# Patient Record
Sex: Female | Born: 1950 | Race: White | Hispanic: No | Smoking: Current every day smoker
Health system: Southern US, Community
[De-identification: ages and names within clinical notes are randomized; demographics above are authoritative.]

## PROBLEM LIST (undated history)

## (undated) DIAGNOSIS — M79606 Pain in leg, unspecified: Secondary | ICD-10-CM

## (undated) DIAGNOSIS — F32A Depression, unspecified: Secondary | ICD-10-CM

## (undated) DIAGNOSIS — Z972 Presence of dental prosthetic device (complete) (partial): Secondary | ICD-10-CM

## (undated) DIAGNOSIS — S0990XA Unspecified injury of head, initial encounter: Secondary | ICD-10-CM

## (undated) DIAGNOSIS — T07XXXA Unspecified multiple injuries, initial encounter: Secondary | ICD-10-CM

## (undated) DIAGNOSIS — M25551 Pain in right hip: Secondary | ICD-10-CM

## (undated) DIAGNOSIS — G894 Chronic pain syndrome: Secondary | ICD-10-CM

## (undated) DIAGNOSIS — F329 Major depressive disorder, single episode, unspecified: Secondary | ICD-10-CM

## (undated) DIAGNOSIS — F419 Anxiety disorder, unspecified: Secondary | ICD-10-CM

## (undated) DIAGNOSIS — Z87820 Personal history of traumatic brain injury: Secondary | ICD-10-CM

## (undated) DIAGNOSIS — M542 Cervicalgia: Secondary | ICD-10-CM

## (undated) DIAGNOSIS — M47812 Spondylosis without myelopathy or radiculopathy, cervical region: Secondary | ICD-10-CM

## (undated) DIAGNOSIS — I10 Essential (primary) hypertension: Secondary | ICD-10-CM

## (undated) HISTORY — DX: Major depressive disorder, single episode, unspecified: F32.9

## (undated) HISTORY — DX: Unspecified multiple injuries, initial encounter: T07.XXXA

## (undated) HISTORY — DX: Unspecified injury of head, initial encounter: S09.90XA

## (undated) HISTORY — PX: BREAST EXCISIONAL BIOPSY: SUR124

## (undated) HISTORY — DX: Spondylosis without myelopathy or radiculopathy, cervical region: M47.812

## (undated) HISTORY — DX: Pain in right hip: M25.551

## (undated) HISTORY — PX: LEG SURGERY: SHX1003

## (undated) HISTORY — DX: Essential (primary) hypertension: I10

## (undated) HISTORY — DX: Depression, unspecified: F32.A

## (undated) HISTORY — DX: Anxiety disorder, unspecified: F41.9

## (undated) HISTORY — PX: ABDOMINAL HYSTERECTOMY: SHX81

## (undated) HISTORY — DX: Cervicalgia: M54.2

## (undated) HISTORY — DX: Pain in leg, unspecified: M79.606

## (undated) HISTORY — DX: Personal history of traumatic brain injury: Z87.820

---

## 2004-05-02 ENCOUNTER — Ambulatory Visit: Payer: Self-pay | Admitting: Internal Medicine

## 2005-08-28 ENCOUNTER — Ambulatory Visit: Payer: Self-pay | Admitting: Obstetrics and Gynecology

## 2007-06-17 ENCOUNTER — Ambulatory Visit: Payer: Self-pay | Admitting: Internal Medicine

## 2008-06-27 ENCOUNTER — Ambulatory Visit: Payer: Self-pay | Admitting: Family Medicine

## 2008-08-09 ENCOUNTER — Ambulatory Visit: Payer: Self-pay | Admitting: Internal Medicine

## 2011-02-12 ENCOUNTER — Inpatient Hospital Stay: Payer: Self-pay | Admitting: Psychiatry

## 2011-02-25 ENCOUNTER — Inpatient Hospital Stay: Payer: Self-pay | Admitting: Psychiatry

## 2012-09-02 ENCOUNTER — Emergency Department: Payer: Self-pay | Admitting: Emergency Medicine

## 2012-09-06 DIAGNOSIS — N814 Uterovaginal prolapse, unspecified: Secondary | ICD-10-CM | POA: Insufficient documentation

## 2012-09-06 DIAGNOSIS — T07XXXA Unspecified multiple injuries, initial encounter: Secondary | ICD-10-CM

## 2012-09-06 DIAGNOSIS — I609 Nontraumatic subarachnoid hemorrhage, unspecified: Secondary | ICD-10-CM | POA: Insufficient documentation

## 2012-09-06 DIAGNOSIS — F4310 Post-traumatic stress disorder, unspecified: Secondary | ICD-10-CM | POA: Insufficient documentation

## 2012-09-06 DIAGNOSIS — IMO0002 Reserved for concepts with insufficient information to code with codable children: Secondary | ICD-10-CM | POA: Insufficient documentation

## 2012-09-06 DIAGNOSIS — S32029A Unspecified fracture of second lumbar vertebra, initial encounter for closed fracture: Secondary | ICD-10-CM | POA: Insufficient documentation

## 2012-09-06 DIAGNOSIS — T1490XA Injury, unspecified, initial encounter: Secondary | ICD-10-CM | POA: Insufficient documentation

## 2012-09-06 DIAGNOSIS — S32599A Other specified fracture of unspecified pubis, initial encounter for closed fracture: Secondary | ICD-10-CM | POA: Insufficient documentation

## 2012-09-06 DIAGNOSIS — F1020 Alcohol dependence, uncomplicated: Secondary | ICD-10-CM | POA: Insufficient documentation

## 2012-09-06 DIAGNOSIS — S32509A Unspecified fracture of unspecified pubis, initial encounter for closed fracture: Secondary | ICD-10-CM | POA: Insufficient documentation

## 2012-09-06 DIAGNOSIS — S82209A Unspecified fracture of shaft of unspecified tibia, initial encounter for closed fracture: Secondary | ICD-10-CM | POA: Insufficient documentation

## 2012-09-06 HISTORY — DX: Unspecified multiple injuries, initial encounter: T07.XXXA

## 2012-10-30 ENCOUNTER — Encounter: Payer: Self-pay | Admitting: Physical Medicine and Rehabilitation

## 2012-11-06 ENCOUNTER — Encounter: Payer: Self-pay | Admitting: Physical Medicine and Rehabilitation

## 2012-12-07 ENCOUNTER — Encounter: Payer: Self-pay | Admitting: Physical Medicine and Rehabilitation

## 2013-01-07 DIAGNOSIS — N3946 Mixed incontinence: Secondary | ICD-10-CM | POA: Insufficient documentation

## 2013-01-07 DIAGNOSIS — N8111 Cystocele, midline: Secondary | ICD-10-CM | POA: Insufficient documentation

## 2014-02-28 ENCOUNTER — Emergency Department: Payer: Self-pay | Admitting: Emergency Medicine

## 2014-02-28 LAB — URINALYSIS, COMPLETE
BLOOD: NEGATIVE
Bilirubin,UR: NEGATIVE
GLUCOSE, UR: NEGATIVE mg/dL (ref 0–75)
KETONE: NEGATIVE
Nitrite: NEGATIVE
PH: 5 (ref 4.5–8.0)
PROTEIN: NEGATIVE
RBC,UR: 2 /HPF (ref 0–5)
SPECIFIC GRAVITY: 1.016 (ref 1.003–1.030)
Squamous Epithelial: 3
WBC UR: 6 /HPF (ref 0–5)

## 2014-02-28 LAB — CBC WITH DIFFERENTIAL/PLATELET
BASOS ABS: 0.1 10*3/uL (ref 0.0–0.1)
Basophil %: 0.8 %
Eosinophil #: 0 10*3/uL (ref 0.0–0.7)
Eosinophil %: 0.4 %
HCT: 40.8 % (ref 35.0–47.0)
HGB: 13.4 g/dL (ref 12.0–16.0)
LYMPHS ABS: 2.3 10*3/uL (ref 1.0–3.6)
Lymphocyte %: 18.8 %
MCH: 32 pg (ref 26.0–34.0)
MCHC: 32.8 g/dL (ref 32.0–36.0)
MCV: 98 fL (ref 80–100)
MONOS PCT: 7.5 %
Monocyte #: 0.9 x10 3/mm (ref 0.2–0.9)
Neutrophil #: 9 10*3/uL — ABNORMAL HIGH (ref 1.4–6.5)
Neutrophil %: 72.5 %
PLATELETS: 433 10*3/uL (ref 150–440)
RBC: 4.19 10*6/uL (ref 3.80–5.20)
RDW: 13.5 % (ref 11.5–14.5)
WBC: 12.4 10*3/uL — ABNORMAL HIGH (ref 3.6–11.0)

## 2014-02-28 LAB — COMPREHENSIVE METABOLIC PANEL
ALBUMIN: 3.9 g/dL (ref 3.4–5.0)
AST: 19 U/L (ref 15–37)
Alkaline Phosphatase: 103 U/L
Anion Gap: 8 (ref 7–16)
BILIRUBIN TOTAL: 0.4 mg/dL (ref 0.2–1.0)
BUN: 8 mg/dL (ref 7–18)
Calcium, Total: 8.8 mg/dL (ref 8.5–10.1)
Chloride: 103 mmol/L (ref 98–107)
Co2: 26 mmol/L (ref 21–32)
Creatinine: 0.76 mg/dL (ref 0.60–1.30)
EGFR (African American): >60
EGFR (Non-African Amer.): >60
GLUCOSE: 113 mg/dL — AB (ref 65–99)
Osmolality: 273 (ref 275–301)
POTASSIUM: 3.9 mmol/L (ref 3.5–5.1)
SGPT (ALT): 17 U/L
Sodium: 137 mmol/L (ref 136–145)
Total Protein: 7.1 g/dL (ref 6.4–8.2)

## 2014-03-25 DIAGNOSIS — Z8679 Personal history of other diseases of the circulatory system: Secondary | ICD-10-CM | POA: Insufficient documentation

## 2014-03-25 DIAGNOSIS — I10 Essential (primary) hypertension: Secondary | ICD-10-CM | POA: Insufficient documentation

## 2014-03-25 DIAGNOSIS — E785 Hyperlipidemia, unspecified: Secondary | ICD-10-CM | POA: Insufficient documentation

## 2014-03-25 DIAGNOSIS — I341 Nonrheumatic mitral (valve) prolapse: Secondary | ICD-10-CM | POA: Insufficient documentation

## 2014-05-13 ENCOUNTER — Ambulatory Visit: Payer: Self-pay | Admitting: Pain Medicine

## 2015-02-06 ENCOUNTER — Other Ambulatory Visit: Payer: Self-pay | Admitting: Pain Medicine

## 2015-02-06 ENCOUNTER — Ambulatory Visit: Payer: Medicare Other | Attending: Pain Medicine | Admitting: Pain Medicine

## 2015-02-06 ENCOUNTER — Encounter: Payer: Self-pay | Admitting: Pain Medicine

## 2015-02-06 VITALS — BP 113/67 | HR 100 | Temp 98.6°F | Resp 18 | Ht 62.0 in | Wt 126.0 lb

## 2015-02-06 DIAGNOSIS — E785 Hyperlipidemia, unspecified: Secondary | ICD-10-CM | POA: Insufficient documentation

## 2015-02-06 DIAGNOSIS — M542 Cervicalgia: Secondary | ICD-10-CM | POA: Insufficient documentation

## 2015-02-06 DIAGNOSIS — G8929 Other chronic pain: Secondary | ICD-10-CM | POA: Diagnosis not present

## 2015-02-06 DIAGNOSIS — M47896 Other spondylosis, lumbar region: Secondary | ICD-10-CM

## 2015-02-06 DIAGNOSIS — F1190 Opioid use, unspecified, uncomplicated: Secondary | ICD-10-CM

## 2015-02-06 DIAGNOSIS — M545 Low back pain, unspecified: Secondary | ICD-10-CM

## 2015-02-06 DIAGNOSIS — M47816 Spondylosis without myelopathy or radiculopathy, lumbar region: Secondary | ICD-10-CM | POA: Diagnosis not present

## 2015-02-06 DIAGNOSIS — M79606 Pain in leg, unspecified: Secondary | ICD-10-CM

## 2015-02-06 DIAGNOSIS — Z87820 Personal history of traumatic brain injury: Secondary | ICD-10-CM

## 2015-02-06 DIAGNOSIS — F17200 Nicotine dependence, unspecified, uncomplicated: Secondary | ICD-10-CM | POA: Diagnosis not present

## 2015-02-06 DIAGNOSIS — M25551 Pain in right hip: Secondary | ICD-10-CM | POA: Diagnosis not present

## 2015-02-06 DIAGNOSIS — Z79891 Long term (current) use of opiate analgesic: Secondary | ICD-10-CM | POA: Insufficient documentation

## 2015-02-06 DIAGNOSIS — F1120 Opioid dependence, uncomplicated: Secondary | ICD-10-CM

## 2015-02-06 DIAGNOSIS — F411 Generalized anxiety disorder: Secondary | ICD-10-CM | POA: Diagnosis not present

## 2015-02-06 DIAGNOSIS — S32020D Wedge compression fracture of second lumbar vertebra, subsequent encounter for fracture with routine healing: Secondary | ICD-10-CM | POA: Insufficient documentation

## 2015-02-06 DIAGNOSIS — M47812 Spondylosis without myelopathy or radiculopathy, cervical region: Secondary | ICD-10-CM

## 2015-02-06 DIAGNOSIS — M5412 Radiculopathy, cervical region: Secondary | ICD-10-CM

## 2015-02-06 DIAGNOSIS — G894 Chronic pain syndrome: Secondary | ICD-10-CM | POA: Diagnosis not present

## 2015-02-06 DIAGNOSIS — F329 Major depressive disorder, single episode, unspecified: Secondary | ICD-10-CM | POA: Insufficient documentation

## 2015-02-06 DIAGNOSIS — Z5181 Encounter for therapeutic drug level monitoring: Secondary | ICD-10-CM

## 2015-02-06 DIAGNOSIS — M5416 Radiculopathy, lumbar region: Secondary | ICD-10-CM

## 2015-02-06 DIAGNOSIS — M4856XA Collapsed vertebra, not elsewhere classified, lumbar region, initial encounter for fracture: Secondary | ICD-10-CM | POA: Insufficient documentation

## 2015-02-06 DIAGNOSIS — Z79899 Other long term (current) drug therapy: Secondary | ICD-10-CM

## 2015-02-06 DIAGNOSIS — M791 Myalgia: Secondary | ICD-10-CM

## 2015-02-06 DIAGNOSIS — M7918 Myalgia, other site: Secondary | ICD-10-CM

## 2015-02-06 DIAGNOSIS — F32A Depression, unspecified: Secondary | ICD-10-CM

## 2015-02-06 DIAGNOSIS — M546 Pain in thoracic spine: Secondary | ICD-10-CM | POA: Diagnosis present

## 2015-02-06 HISTORY — DX: Pain in leg, unspecified: M79.606

## 2015-02-06 HISTORY — DX: Personal history of traumatic brain injury: Z87.820

## 2015-02-06 HISTORY — DX: Cervicalgia: M54.2

## 2015-02-06 HISTORY — DX: Spondylosis without myelopathy or radiculopathy, cervical region: M47.812

## 2015-02-06 HISTORY — DX: Pain in right hip: M25.551

## 2015-02-06 MED ORDER — TRAMADOL HCL 50 MG PO TABS: 100.0000 mg | ORAL_TABLET | Freq: Four times a day (QID) | ORAL | Status: DC | PRN

## 2015-02-06 MED ORDER — CYCLOBENZAPRINE HCL 10 MG PO TABS: 10.0000 mg | ORAL_TABLET | Freq: Three times a day (TID) | ORAL | Status: DC | PRN
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
## 2015-02-06 NOTE — Patient Instructions (Addendum)
Smoking Cessation, Tips for Success If you are ready to quit smoking, congratulations! You have chosen to help yourself be healthier. Cigarettes bring nicotine, tar, carbon monoxide, and other irritants into your body. Your lungs, heart, and blood vessels will be able to work better without these poisons. There are many different ways to quit smoking. Nicotine gum, nicotine patches, a nicotine inhaler, or nicotine nasal spray can help with physical craving. Hypnosis, support groups, and medicines help break the habit of smoking. WHAT THINGS CAN I DO TO MAKE QUITTING EASIER?  Here are some tips to help you quit for good:  Pick a date when you will quit smoking completely. Tell all of your friends and family about your plan to quit on that date.  Do not try to slowly cut down on the number of cigarettes you are smoking. Pick a quit date and quit smoking completely starting on that day.  Throw away all cigarettes.   Clean and remove all ashtrays from your home, work, and car.  On a card, write down your reasons for quitting. Carry the card with you and read it when you get the urge to smoke.  Cleanse your body of nicotine. Drink enough water and fluids to keep your urine clear or pale yellow. Do this after quitting to flush the nicotine from your body.  Learn to predict your moods. Do not let a bad situation be your excuse to have a cigarette. Some situations in your life might tempt you into wanting a cigarette.  Never have "just one" cigarette. It leads to wanting another and another. Remind yourself of your decision to quit.  Change habits associated with smoking. If you smoked while driving or when feeling stressed, try other activities to replace smoking. Stand up when drinking your coffee. Brush your teeth after eating. Sit in a different chair when you read the paper. Avoid alcohol while trying to quit, and try to drink fewer caffeinated beverages. Alcohol and caffeine may urge you to  smoke.  Avoid foods and drinks that can trigger a desire to smoke, such as sugary or spicy foods and alcohol.  Ask people who smoke not to smoke around you.  Have something planned to do right after eating or having a cup of coffee. For example, plan to take a walk or exercise.  Try a relaxation exercise to calm you down and decrease your stress. Remember, you may be tense and nervous for the first 2 weeks after you quit, but this will pass.  Find new activities to keep your hands busy. Play with a pen, coin, or rubber band. Doodle or draw things on paper.  Brush your teeth right after eating. This will help cut down on the craving for the taste of tobacco after meals. You can also try mouthwash.   Use oral substitutes in place of cigarettes. Try using lemon drops, carrots, cinnamon sticks, or chewing gum. Keep them handy so they are available when you have the urge to smoke.  When you have the urge to smoke, try deep breathing.  Designate your home as a nonsmoking area.  If you are a heavy smoker, ask your health care provider about a prescription for nicotine chewing gum. It can ease your withdrawal from nicotine.  Reward yourself. Set aside the cigarette money you save and buy yourself something nice.  Look for support from others. Join a support group or smoking cessation program. Ask someone at home or at work to help you with your plan   to quit smoking.  Always ask yourself, "Do I need this cigarette or is this just a reflex?" Tell yourself, "Today, I choose not to smoke," or "I do not want to smoke." You are reminding yourself of your decision to quit.  Do not replace cigarette smoking with electronic cigarettes (commonly called e-cigarettes). The safety of e-cigarettes is unknown, and some may contain harmful chemicals.  If you relapse, do not give up! Plan ahead and think about what you will do the next time you get the urge to smoke. HOW WILL I FEEL WHEN I QUIT SMOKING? You  may have symptoms of withdrawal because your body is used to nicotine (the addictive substance in cigarettes). You may crave cigarettes, be irritable, feel very hungry, cough often, get headaches, or have difficulty concentrating. The withdrawal symptoms are only temporary. They are strongest when you first quit but will go away within 10-14 days. When withdrawal symptoms occur, stay in control. Think about your reasons for quitting. Remind yourself that these are signs that your body is healing and getting used to being without cigarettes. Remember that withdrawal symptoms are easier to treat than the major diseases that smoking can cause.  Even after the withdrawal is over, expect periodic urges to smoke. However, these cravings are generally short lived and will go away whether you smoke or not. Do not smoke! WHAT RESOURCES ARE AVAILABLE TO HELP ME QUIT SMOKING? Your health care provider can direct you to community resources or hospitals for support, which may include:  Group support.  Education.  Hypnosis.  Therapy.   This information is not intended to replace advice given to you by your health care provider. Make sure you discuss any questions you have with your health care provider.   Document Released: 12/22/2003 Document Revised: 04/15/2014 Document Reviewed: 09/10/2012 Elsevier Interactive Patient Education 2016 [employer] Take Cyclobenzaprine 5 mg  Two tablets three times per day until you get the new prescription filled, then 1 tablet three times per day

## 2015-02-06 NOTE — Progress Notes (Signed)
Safety precautions to be maintained throughout the outpatient stay will include: orient to surroundings, keep bed in low position, maintain call bell within reach at all times, provide assistance with transfer out of bed and ambulation. Pill count Tramadol #[id number]

## 2015-02-11 LAB — TOXASSURE SELECT 13 (MW), URINE: PDF: 0

## 2015-02-20 ENCOUNTER — Ambulatory Visit: Payer: Medicare Other | Attending: Pain Medicine | Admitting: Pain Medicine

## 2015-02-20 ENCOUNTER — Encounter: Payer: Self-pay | Admitting: Pain Medicine

## 2015-02-20 VITALS — BP 106/72 | HR 109 | Temp 99.1°F | Resp 16 | Ht 62.0 in | Wt 125.0 lb

## 2015-02-20 DIAGNOSIS — M5412 Radiculopathy, cervical region: Secondary | ICD-10-CM | POA: Diagnosis not present

## 2015-02-20 DIAGNOSIS — M47812 Spondylosis without myelopathy or radiculopathy, cervical region: Secondary | ICD-10-CM | POA: Diagnosis not present

## 2015-02-20 DIAGNOSIS — M79604 Pain in right leg: Secondary | ICD-10-CM

## 2015-02-20 DIAGNOSIS — G8929 Other chronic pain: Secondary | ICD-10-CM

## 2015-02-20 DIAGNOSIS — Z79891 Long term (current) use of opiate analgesic: Secondary | ICD-10-CM | POA: Diagnosis not present

## 2015-02-20 DIAGNOSIS — F1190 Opioid use, unspecified, uncomplicated: Secondary | ICD-10-CM

## 2015-02-20 MED ORDER — TRAMADOL HCL 50 MG PO TABS: 100.0000 mg | ORAL_TABLET | Freq: Four times a day (QID) | ORAL | Status: DC | PRN

## 2015-02-20 MED ORDER — CYCLOBENZAPRINE HCL 10 MG PO TABS: 10.0000 mg | ORAL_TABLET | Freq: Three times a day (TID) | ORAL | Status: DC | PRN

## 2015-02-20 NOTE — Progress Notes (Signed)
Safety precautions to be maintained throughout the outpatient stay will include: orient to surroundings, keep bed in low position, maintain call bell within reach at all times, provide assistance with transfer out of bed and ambulation.  

## 2015-02-20 NOTE — Patient Instructions (Signed)
Smoking Cessation, Tips for Success If you are ready to quit smoking, congratulations! You have chosen to help yourself be healthier. Cigarettes bring nicotine, tar, carbon monoxide, and other irritants into your body. Your lungs, heart, and blood vessels will be able to work better without these poisons. There are many different ways to quit smoking. Nicotine gum, nicotine patches, a nicotine inhaler, or nicotine nasal spray can help with physical craving. Hypnosis, support groups, and medicines help break the habit of smoking. WHAT THINGS CAN I DO TO MAKE QUITTING EASIER?  Here are some tips to help you quit for good:  Pick a date when you will quit smoking completely. Tell all of your friends and family about your plan to quit on that date.  Do not try to slowly cut down on the number of cigarettes you are smoking. Pick a quit date and quit smoking completely starting on that day.  Throw away all cigarettes.   Clean and remove all ashtrays from your home, work, and car.  On a card, write down your reasons for quitting. Carry the card with you and read it when you get the urge to smoke.  Cleanse your body of nicotine. Drink enough water and fluids to keep your urine clear or pale yellow. Do this after quitting to flush the nicotine from your body.  Learn to predict your moods. Do not let a bad situation be your excuse to have a cigarette. Some situations in your life might tempt you into wanting a cigarette.  Never have "just one" cigarette. It leads to wanting another and another. Remind yourself of your decision to quit.  Change habits associated with smoking. If you smoked while driving or when feeling stressed, try other activities to replace smoking. Stand up when drinking your coffee. Brush your teeth after eating. Sit in a different chair when you read the paper. Avoid alcohol while trying to quit, and try to drink fewer caffeinated beverages. Alcohol and caffeine may urge you to  smoke.  Avoid foods and drinks that can trigger a desire to smoke, such as sugary or spicy foods and alcohol.  Ask people who smoke not to smoke around you.  Have something planned to do right after eating or having a cup of coffee. For example, plan to take a walk or exercise.  Try a relaxation exercise to calm you down and decrease your stress. Remember, you may be tense and nervous for the first 2 weeks after you quit, but this will pass.  Find new activities to keep your hands busy. Play with a pen, coin, or rubber band. Doodle or draw things on paper.  Brush your teeth right after eating. This will help cut down on the craving for the taste of tobacco after meals. You can also try mouthwash.   Use oral substitutes in place of cigarettes. Try using lemon drops, carrots, cinnamon sticks, or chewing gum. Keep them handy so they are available when you have the urge to smoke.  When you have the urge to smoke, try deep breathing.  Designate your home as a nonsmoking area.  If you are a heavy smoker, ask your health care provider about a prescription for nicotine chewing gum. It can ease your withdrawal from nicotine.  Reward yourself. Set aside the cigarette money you save and buy yourself something nice.  Look for support from others. Join a support group or smoking cessation program. Ask someone at home or at work to help you with your plan   to quit smoking.  Always ask yourself, "Do I need this cigarette or is this just a reflex?" Tell yourself, "Today, I choose not to smoke," or "I do not want to smoke." You are reminding yourself of your decision to quit.  Do not replace cigarette smoking with electronic cigarettes (commonly called e-cigarettes). The safety of e-cigarettes is unknown, and some may contain harmful chemicals.  If you relapse, do not give up! Plan ahead and think about what you will do the next time you get the urge to smoke. HOW WILL I FEEL WHEN I QUIT SMOKING? You  may have symptoms of withdrawal because your body is used to nicotine (the addictive substance in cigarettes). You may crave cigarettes, be irritable, feel very hungry, cough often, get headaches, or have difficulty concentrating. The withdrawal symptoms are only temporary. They are strongest when you first quit but will go away within 10-14 days. When withdrawal symptoms occur, stay in control. Think about your reasons for quitting. Remind yourself that these are signs that your body is healing and getting used to being without cigarettes. Remember that withdrawal symptoms are easier to treat than the major diseases that smoking can cause.  Even after the withdrawal is over, expect periodic urges to smoke. However, these cravings are generally short lived and will go away whether you smoke or not. Do not smoke! WHAT RESOURCES ARE AVAILABLE TO HELP ME QUIT SMOKING? Your health care provider can direct you to community resources or hospitals for support, which may include:  Group support.  Education.  Hypnosis.  Therapy.   This information is not intended to replace advice given to you by your health care provider. Make sure you discuss any questions you have with your health care provider.   Document Released: 12/22/2003 Document Revised: 04/15/2014 Document Reviewed: 09/10/2012 Elsevier Interactive Patient Education 2016 [employer]  

## 2015-02-21 DIAGNOSIS — M79604 Pain in right leg: Secondary | ICD-10-CM

## 2015-02-21 DIAGNOSIS — G8929 Other chronic pain: Secondary | ICD-10-CM | POA: Insufficient documentation
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
## 2015-02-27 ENCOUNTER — Other Ambulatory Visit: Payer: Self-pay | Admitting: Pain Medicine

## 2015-05-22 ENCOUNTER — Encounter: Payer: Self-pay | Admitting: Pain Medicine

## 2015-05-22 ENCOUNTER — Other Ambulatory Visit: Payer: Self-pay | Admitting: Pain Medicine

## 2015-05-22 ENCOUNTER — Ambulatory Visit: Payer: Medicare Other | Attending: Pain Medicine | Admitting: Pain Medicine

## 2015-05-22 VITALS — BP 123/94 | HR 99 | Temp 98.8°F | Resp 16 | Ht 62.0 in | Wt 130.0 lb

## 2015-05-22 DIAGNOSIS — Z79891 Long term (current) use of opiate analgesic: Secondary | ICD-10-CM

## 2015-05-22 DIAGNOSIS — M4856XS Collapsed vertebra, not elsewhere classified, lumbar region, sequela of fracture: Secondary | ICD-10-CM | POA: Insufficient documentation

## 2015-05-22 DIAGNOSIS — G8929 Other chronic pain: Secondary | ICD-10-CM | POA: Diagnosis not present

## 2015-05-22 DIAGNOSIS — S8290XS Unspecified fracture of unspecified lower leg, sequela: Secondary | ICD-10-CM

## 2015-05-22 DIAGNOSIS — M810 Age-related osteoporosis without current pathological fracture: Secondary | ICD-10-CM | POA: Insufficient documentation

## 2015-05-22 DIAGNOSIS — F329 Major depressive disorder, single episode, unspecified: Secondary | ICD-10-CM | POA: Diagnosis not present

## 2015-05-22 DIAGNOSIS — S82409S Unspecified fracture of shaft of unspecified fibula, sequela: Secondary | ICD-10-CM

## 2015-05-22 DIAGNOSIS — M545 Low back pain, unspecified: Secondary | ICD-10-CM

## 2015-05-22 DIAGNOSIS — X58XXXA Exposure to other specified factors, initial encounter: Secondary | ICD-10-CM | POA: Insufficient documentation

## 2015-05-22 DIAGNOSIS — S82209S Unspecified fracture of shaft of unspecified tibia, sequela: Secondary | ICD-10-CM | POA: Insufficient documentation

## 2015-05-22 DIAGNOSIS — Z5181 Encounter for therapeutic drug level monitoring: Secondary | ICD-10-CM | POA: Diagnosis not present

## 2015-05-22 DIAGNOSIS — E785 Hyperlipidemia, unspecified: Secondary | ICD-10-CM | POA: Diagnosis not present

## 2015-05-22 DIAGNOSIS — F419 Anxiety disorder, unspecified: Secondary | ICD-10-CM | POA: Insufficient documentation

## 2015-05-22 DIAGNOSIS — M542 Cervicalgia: Secondary | ICD-10-CM | POA: Diagnosis present

## 2015-05-22 DIAGNOSIS — M79606 Pain in leg, unspecified: Secondary | ICD-10-CM | POA: Diagnosis present

## 2015-05-22 DIAGNOSIS — F1190 Opioid use, unspecified, uncomplicated: Secondary | ICD-10-CM | POA: Diagnosis not present

## 2015-05-22 DIAGNOSIS — N3946 Mixed incontinence: Secondary | ICD-10-CM | POA: Insufficient documentation

## 2015-05-22 DIAGNOSIS — F17200 Nicotine dependence, unspecified, uncomplicated: Secondary | ICD-10-CM | POA: Diagnosis not present

## 2015-05-22 DIAGNOSIS — I10 Essential (primary) hypertension: Secondary | ICD-10-CM | POA: Diagnosis not present

## 2015-05-22 DIAGNOSIS — M1611 Unilateral primary osteoarthritis, right hip: Secondary | ICD-10-CM | POA: Diagnosis not present

## 2015-05-22 DIAGNOSIS — M47812 Spondylosis without myelopathy or radiculopathy, cervical region: Secondary | ICD-10-CM | POA: Diagnosis not present

## 2015-05-22 DIAGNOSIS — M549 Dorsalgia, unspecified: Secondary | ICD-10-CM | POA: Diagnosis present

## 2015-05-22 DIAGNOSIS — S32509S Unspecified fracture of unspecified pubis, sequela: Secondary | ICD-10-CM | POA: Insufficient documentation

## 2015-05-22 DIAGNOSIS — S32020S Wedge compression fracture of second lumbar vertebra, sequela: Secondary | ICD-10-CM

## 2015-05-22 DIAGNOSIS — S32502S Unspecified fracture of left pubis, sequela: Secondary | ICD-10-CM

## 2015-05-22 LAB — COMPREHENSIVE METABOLIC PANEL
ALBUMIN: 4.3 g/dL (ref 3.5–5.0)
ALT: 13 U/L — AB (ref 14–54)
AST: 18 U/L (ref 15–41)
Alkaline Phosphatase: 136 U/L — ABNORMAL HIGH (ref 38–126)
Anion gap: 7 (ref 5–15)
BUN: 15 mg/dL (ref 6–20)
CHLORIDE: 98 mmol/L — AB (ref 101–111)
CO2: 28 mmol/L (ref 22–32)
CREATININE: 0.94 mg/dL (ref 0.44–1.00)
Calcium: 9 mg/dL (ref 8.9–10.3)
GFR calc Af Amer: >60 mL/min (ref >60)
GFR calc non Af Amer: >60 mL/min (ref >60)
GLUCOSE: 104 mg/dL — AB (ref 65–99)
POTASSIUM: 4.3 mmol/L (ref 3.5–5.1)
SODIUM: 133 mmol/L — AB (ref 135–145)
Total Bilirubin: 0.5 mg/dL (ref 0.3–1.2)
Total Protein: 7.5 g/dL (ref 6.5–8.1)

## 2015-05-22 LAB — SEDIMENTATION RATE: Sed Rate: 33 mm/hr — ABNORMAL HIGH (ref 0–30)

## 2015-05-22 LAB — MAGNESIUM: Magnesium: 2.2 mg/dL (ref 1.7–2.4)

## 2015-05-22 MED ORDER — CYCLOBENZAPRINE HCL 10 MG PO TABS: 10.0000 mg | ORAL_TABLET | Freq: Three times a day (TID) | ORAL | Status: DC | PRN

## 2015-05-22 MED ORDER — TRAMADOL HCL 50 MG PO TABS: 100.0000 mg | ORAL_TABLET | Freq: Four times a day (QID) | ORAL | Status: DC | PRN

## 2015-05-22 NOTE — Patient Instructions (Signed)
A prescription for FLEXERIL was sent to your pharmacy and should be available for pickup today.  A prescription for TRAMADOL was given to you today.  Please get your lab work done as soon as possible.

## 2015-05-22 NOTE — Progress Notes (Signed)

## 2015-05-22 NOTE — Progress Notes (Signed)
Quick Note:   Low levels of sodium in blood is known as "Hyponatremia". When severe, symptoms can include: headaches; confusion or altered mental state; seizures; decreased consciousness which can proceed to coma and death. Less severe symptoms include: restlessness; muscle spasms or cramps; weakness, and tiredness. Causes may include: chronic conditions such as kidney failure (when excess fluid cannot be efficiently excreted) and congestive heart failure, in which excess fluid accumulates in the body. SIADH (syndrome of inappropriate anti-diuretic hormone) is a disease whereby the body produces too much anti-diuretic hormone (ADH), resulting in retention of water in the body. These are considered to be dilutional causes. In addition, it can also result when sodium is lost from the body as is the case during prolonged sweating and severe vomiting or diarrhoea. Medical conditions that can sometimes be associated with hyponatraemia include adrenal insufficiency, hypothyroidism, and cirrhosis of the liver. The eating disorder anorexia can also cause a sodium imbalance. Some drugs can lower blood sodium levels. Examples of these include diuretics (water tablets), desmopressin, and sulfonylureas.  Normal chloride levels are between 95 and 107 mEq/L. Low levels may be due to: Addison disease; Bartter syndrome; burns; congestive heart failure; dehydration; excessive sweating; hyperaldosteronism; metabolic alkalosis; respiratory acidosis (compensated); Syndrome of inappropriate diuretic hormone secretion (SIADH); or vomiting.  While most low ALT level results indicate a normal healthy liver, that may not always be the case. A low-functioning or non-functioning liver, lacking normal levels of ALT activity to begin with, would not release a lot of ALT into the blood when damaged. People infected with the hepatitis C virus initially show high ALT levels in their blood, but these levels fall over time. Because the ALT  test measures ALT levels at only one point in time, people with chronic hepatitis C infection may already have experienced the ALT peak well before blood was drawn for the ALT test. Urinary tract infections or malnutrition may also cause low blood ALT levels.  Normal ALP (Alkaline phosphatase) levels are between 35 -105 IU/L, for our Lab. High ALP could suggest liver damage or increased bone cell activity. If other tests such as bilirubin, aspartate aminotransferase (AST), or alanine aminotransferase (ALT) are also high, usually the increased ALP is coming from the liver. Higher-than-normal ALP levels can be seen with: biliary obstruction; bone conditions; osteoblastic bone tumors; osteomalacia; a healing fracture; liver disease; hepatitis; eating a fatty meal if you have blood type O or B; hyperparathyroidism; leukemia; lymphoma; Paget disease; rickets; and/or sarcoidosis.  ______

## 2015-05-22 NOTE — Progress Notes (Signed)
Pill Count: Tramadol - # 240

## 2015-05-22 NOTE — Progress Notes (Signed)
Quick Note:  Lab results reviewed and found to be within normal limits. ______ 
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
## 2015-05-23 LAB — C-REACTIVE PROTEIN: CRP: 1 mg/dL — ABNORMAL HIGH (ref <1.0)

## 2015-05-26 LAB — TOXASSURE SELECT 13 (MW), URINE: PDF: 0

## 2015-06-04 NOTE — Progress Notes (Signed)
Quick Note:  Lab results reviewed and found to be within normal limits. ______ 

## 2015-06-04 NOTE — Progress Notes (Signed)

## 2015-06-21 ENCOUNTER — Other Ambulatory Visit: Payer: Self-pay | Admitting: Pain Medicine

## 2015-07-15 ENCOUNTER — Other Ambulatory Visit: Payer: Self-pay | Admitting: Pain Medicine

## 2015-07-15 NOTE — Telephone Encounter (Signed)

## 2015-08-16 ENCOUNTER — Encounter: Payer: Medicare Other | Admitting: Pain Medicine

## 2015-08-16 ENCOUNTER — Other Ambulatory Visit: Payer: Self-pay

## 2015-08-16 DIAGNOSIS — G8929 Other chronic pain: Secondary | ICD-10-CM

## 2015-08-16 MED ORDER — TRAMADOL HCL 50 MG PO TABS: 100.0000 mg | ORAL_TABLET | Freq: Four times a day (QID) | ORAL | Status: DC | PRN

## 2015-09-06 ENCOUNTER — Other Ambulatory Visit
Admission: RE | Admit: 2015-09-06 | Discharge: 2015-09-06 | Disposition: A | Discharge status: Home / Self Care | Payer: Medicare Other | Source: Ambulatory Visit | Attending: Pain Medicine | Admitting: Pain Medicine

## 2015-09-06 ENCOUNTER — Encounter: Payer: Self-pay | Admitting: Pain Medicine

## 2015-09-06 ENCOUNTER — Ambulatory Visit (HOSPITAL_BASED_OUTPATIENT_CLINIC_OR_DEPARTMENT_OTHER): Payer: Medicare Other | Admitting: Pain Medicine

## 2015-09-06 ENCOUNTER — Ambulatory Visit
Admission: RE | Admit: 2015-09-06 | Discharge: 2015-09-06 | Disposition: A | Discharge status: Home / Self Care | Payer: Medicare Other | Source: Ambulatory Visit | Attending: Pain Medicine | Admitting: Pain Medicine

## 2015-09-06 VITALS — BP 133/90 | HR 98 | Temp 98.6°F | Resp 18 | Ht 62.0 in | Wt 140.0 lb

## 2015-09-06 DIAGNOSIS — G8929 Other chronic pain: Secondary | ICD-10-CM

## 2015-09-06 DIAGNOSIS — R7982 Elevated C-reactive protein (CRP): Secondary | ICD-10-CM | POA: Insufficient documentation

## 2015-09-06 DIAGNOSIS — M545 Low back pain: Secondary | ICD-10-CM | POA: Diagnosis not present

## 2015-09-06 DIAGNOSIS — R938 Abnormal findings on diagnostic imaging of other specified body structures: Secondary | ICD-10-CM | POA: Diagnosis not present

## 2015-09-06 DIAGNOSIS — M1611 Unilateral primary osteoarthritis, right hip: Secondary | ICD-10-CM

## 2015-09-06 DIAGNOSIS — Z79891 Long term (current) use of opiate analgesic: Secondary | ICD-10-CM | POA: Diagnosis not present

## 2015-09-06 DIAGNOSIS — M542 Cervicalgia: Secondary | ICD-10-CM

## 2015-09-06 DIAGNOSIS — Z5181 Encounter for therapeutic drug level monitoring: Secondary | ICD-10-CM | POA: Diagnosis not present

## 2015-09-06 DIAGNOSIS — M5136 Other intervertebral disc degeneration, lumbar region: Secondary | ICD-10-CM | POA: Insufficient documentation

## 2015-09-06 DIAGNOSIS — M5106 Intervertebral disc disorders with myelopathy, lumbar region: Secondary | ICD-10-CM | POA: Insufficient documentation

## 2015-09-06 DIAGNOSIS — M5412 Radiculopathy, cervical region: Secondary | ICD-10-CM

## 2015-09-06 DIAGNOSIS — M5416 Radiculopathy, lumbar region: Secondary | ICD-10-CM

## 2015-09-06 DIAGNOSIS — R700 Elevated erythrocyte sedimentation rate: Secondary | ICD-10-CM

## 2015-09-06 DIAGNOSIS — F1190 Opioid use, unspecified, uncomplicated: Secondary | ICD-10-CM

## 2015-09-06 DIAGNOSIS — M25551 Pain in right hip: Secondary | ICD-10-CM

## 2015-09-06 DIAGNOSIS — S32020D Wedge compression fracture of second lumbar vertebra, subsequent encounter for fracture with routine healing: Secondary | ICD-10-CM

## 2015-09-06 MED ORDER — TRAMADOL HCL 50 MG PO TABS: 100.0000 mg | ORAL_TABLET | Freq: Three times a day (TID) | ORAL | Status: DC | PRN

## 2015-09-06 MED ORDER — CYCLOBENZAPRINE HCL 10 MG PO TABS: 10.0000 mg | ORAL_TABLET | Freq: Three times a day (TID) | ORAL | Status: DC | PRN

## 2015-09-06 NOTE — Progress Notes (Signed)
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 driver must accompany you and be prepared to drive you home after your procedure. 5. Bring all your current medications with you. 6. An IV may be inserted and sedation may be given at the discretion of the physician. 7. A blood pressure cuff, EKG and other monitors will often be applied during the procedure.  Some patients may need to have extra oxygen administered for a short period. 8. You will be asked to provide medical information, including your allergies and medications, prior to the procedure.  We must know immediately if you are taking blood thinners (like Coumadin/Warfarin) or if you are allergic to IV iodine contrast (dye).  We must know if you could possible be pregnant.  Possible side-effects:   Bleeding from needle site  Infection (rare, may require surgery)  Nerve injury (rare)  Numbness & tingling  (temporary)  Difficulty urinating (rare, temporary)  Spinal headache (a headache worse with upright posture)  Light-headedness (temporary)  Pain at injection site (serveral days)  Decreased blood pressure (rare, temporary)  Weakness in arm/leg (temporary)  Pressure sensation in back/neck (temporary)   Call if you experience:   Fever/chills associated with headache or increased back/neck pain  Headache worsened by an upright position  New onset, weakness or numbness of an extremity below the injection site  Hives or difficulty breathing (go to the emergency room)  Inflammation or drainage at the injection site(s)  Severe back/neck pain greater than usual  New symptoms which are concerning to you  Please note:  Although the local anesthetic injected can often make your back or neck feel good for several hours after the injection, the pain will likely return. It takes 3-7 days for steroids to work.  You may not notice any pain relief for at least one week.  If effective, we will often do a series of 2-3 injections spaced 3-6 weeks apart to maximally decrease your pain.  After the initial series, you may be a candidate for a more permanent nerve block of the facets.  If you have any questions, please call #[phone] [facility]

## 2015-09-06 NOTE — Patient Instructions (Addendum)
You were given prescriptions for Tramadol and Flexeril today. Post op pain management information given. Facet Blocks Patient Information  Description: The facets are joints in the spine between the vertebrae.  Like any joints in the body, facets can become irritated and painful.  Arthritis can also effect the facets.  By injecting steroids and local anesthetic in and around these joints, we can temporarily block the nerve supply to them.  Steroids act directly on irritated nerves and tissues to reduce selling and inflammation which often leads to decreased pain.  Facet blocks may be done anywhere along the spine from the neck to the low back depending upon the location of your pain.   After numbing the skin with local anesthetic (like Novocaine), a small needle is passed onto the facet joints under x-ray guidance.  You may experience a sensation of pressure while this is being done.  The entire block usually lasts about 15-25 minutes.   Conditions which may be treated by facet blocks:   Low back/buttock pain  Neck/shoulder pain  Certain types of headaches  Preparation for the injection:  1. Do not eat any solid food or dairy products within 8 hours of your appointment. 2. You may drink clear liquid up to 3 hours before appointment.  Clear liquids include water, black coffee, juice or soda.  No milk or cream please. 3. You may take your regular medication, including pain medications, with a sip of water before your appointment.  Diabetics should hold regular insulin (if taken separately) and take 1/2 normal NPH dose the morning of the procedure.  Carry some sugar containing items with you to your appointment. 4. A driver must accompany you and be prepared to drive you home after your procedure. 5. Bring all your current medications with you. 6. An IV may be inserted and sedation may be given at the discretion of the physician. 7. A blood pressure cuff, EKG and other monitors will often be  applied during the procedure.  Some patients may need to have extra oxygen administered for a short period. 8. You will be asked to provide medical information, including your allergies and medications, prior to the procedure.  We must know immediately if you are taking blood thinners (like Coumadin/Warfarin) or if you are allergic to IV iodine contrast (dye).  We must know if you could possible be pregnant.  Possible side-effects:   Bleeding from needle site  Infection (rare, may require surgery)  Nerve injury (rare)  Numbness & tingling (temporary)  Difficulty urinating (rare, temporary)  Spinal headache (a headache worse with upright posture)  Light-headedness (temporary)  Pain at injection site (serveral days)  Decreased blood pressure (rare, temporary)  Weakness in arm/leg (temporary)  Pressure sensation in back/neck (temporary)   Call if you experience:   Fever/chills associated with headache or increased back/neck pain  Headache worsened by an upright position  New onset, weakness or numbness of an extremity below the injection site  Hives or difficulty breathing (go to the emergency room)  Inflammation or drainage at the injection site(s)  Severe back/neck pain greater than usual  New symptoms which are concerning to you  Please note:  Although the local anesthetic injected can often make your back or neck feel good for several hours after the injection, the pain will likely return. It takes 3-7 days for steroids to work.  You may not notice any pain relief for at least one week.  If effective, we will often do a series of  2-3 injections spaced 3-6 weeks apart to maximally decrease your pain.  After the initial series, you may be a candidate for a more permanent nerve block of the facets.  If you have any questions, please call #[phone]) [facility]

## 2015-09-06 NOTE — Progress Notes (Signed)
Pill count: Tramadol 139/240; filled 08/19/15

## 2015-09-07 LAB — ANA COMPREHENSIVE PANEL
Anti JO-1: <0.2 AI (ref 0.0–0.9)
SSB (La) (ENA) Antibody, IgG: <0.2 AI (ref 0.0–0.9)

## 2015-09-07 LAB — RHEUMATOID FACTOR

## 2015-09-12 LAB — TOXASSURE SELECT 13 (MW), URINE: PDF: 0

## 2015-09-25 NOTE — Progress Notes (Signed)
Quick Note:  Results were reviewed and found to be: abnormal   Based on these results: the patient may be a candidate for interventional pain management options  In addition: surgical consultation may be of benefit ______ 

## 2015-11-27 ENCOUNTER — Ambulatory Visit: Payer: Medicare Other | Attending: Pain Medicine | Admitting: Pain Medicine

## 2015-11-27 ENCOUNTER — Encounter: Payer: Self-pay | Admitting: Pain Medicine

## 2015-11-27 VITALS — BP 120/63 | HR 104 | Temp 98.9°F | Resp 16 | Ht 62.0 in | Wt 140.0 lb

## 2015-11-27 DIAGNOSIS — M7918 Myalgia, other site: Secondary | ICD-10-CM

## 2015-11-27 DIAGNOSIS — M5416 Radiculopathy, lumbar region: Secondary | ICD-10-CM

## 2015-11-27 DIAGNOSIS — M1611 Unilateral primary osteoarthritis, right hip: Secondary | ICD-10-CM | POA: Insufficient documentation

## 2015-11-27 DIAGNOSIS — S82409A Unspecified fracture of shaft of unspecified fibula, initial encounter for closed fracture: Secondary | ICD-10-CM | POA: Insufficient documentation

## 2015-11-27 DIAGNOSIS — I608 Other nontraumatic subarachnoid hemorrhage: Secondary | ICD-10-CM | POA: Diagnosis not present

## 2015-11-27 DIAGNOSIS — F1190 Opioid use, unspecified, uncomplicated: Secondary | ICD-10-CM | POA: Diagnosis not present

## 2015-11-27 DIAGNOSIS — S32029A Unspecified fracture of second lumbar vertebra, initial encounter for closed fracture: Secondary | ICD-10-CM | POA: Diagnosis not present

## 2015-11-27 DIAGNOSIS — M545 Low back pain, unspecified: Secondary | ICD-10-CM

## 2015-11-27 DIAGNOSIS — E785 Hyperlipidemia, unspecified: Secondary | ICD-10-CM | POA: Insufficient documentation

## 2015-11-27 DIAGNOSIS — M4726 Other spondylosis with radiculopathy, lumbar region: Secondary | ICD-10-CM | POA: Diagnosis not present

## 2015-11-27 DIAGNOSIS — M1610 Unilateral primary osteoarthritis, unspecified hip: Secondary | ICD-10-CM | POA: Insufficient documentation

## 2015-11-27 DIAGNOSIS — M791 Myalgia: Secondary | ICD-10-CM

## 2015-11-27 DIAGNOSIS — X58XXXA Exposure to other specified factors, initial encounter: Secondary | ICD-10-CM | POA: Diagnosis not present

## 2015-11-27 DIAGNOSIS — F4310 Post-traumatic stress disorder, unspecified: Secondary | ICD-10-CM | POA: Diagnosis not present

## 2015-11-27 DIAGNOSIS — Z79891 Long term (current) use of opiate analgesic: Secondary | ICD-10-CM | POA: Diagnosis not present

## 2015-11-27 DIAGNOSIS — I10 Essential (primary) hypertension: Secondary | ICD-10-CM | POA: Insufficient documentation

## 2015-11-27 DIAGNOSIS — M810 Age-related osteoporosis without current pathological fracture: Secondary | ICD-10-CM | POA: Diagnosis not present

## 2015-11-27 DIAGNOSIS — S32509A Unspecified fracture of unspecified pubis, initial encounter for closed fracture: Secondary | ICD-10-CM | POA: Insufficient documentation

## 2015-11-27 DIAGNOSIS — M542 Cervicalgia: Secondary | ICD-10-CM | POA: Insufficient documentation

## 2015-11-27 DIAGNOSIS — F329 Major depressive disorder, single episode, unspecified: Secondary | ICD-10-CM | POA: Diagnosis not present

## 2015-11-27 DIAGNOSIS — N8110 Cystocele, unspecified: Secondary | ICD-10-CM | POA: Diagnosis not present

## 2015-11-27 DIAGNOSIS — F411 Generalized anxiety disorder: Secondary | ICD-10-CM | POA: Diagnosis not present

## 2015-11-27 DIAGNOSIS — Z90710 Acquired absence of both cervix and uterus: Secondary | ICD-10-CM | POA: Diagnosis not present

## 2015-11-27 DIAGNOSIS — R700 Elevated erythrocyte sedimentation rate: Secondary | ICD-10-CM | POA: Insufficient documentation

## 2015-11-27 DIAGNOSIS — N3946 Mixed incontinence: Secondary | ICD-10-CM | POA: Insufficient documentation

## 2015-11-27 DIAGNOSIS — M79602 Pain in left arm: Secondary | ICD-10-CM | POA: Diagnosis present

## 2015-11-27 DIAGNOSIS — F1020 Alcohol dependence, uncomplicated: Secondary | ICD-10-CM | POA: Diagnosis not present

## 2015-11-27 DIAGNOSIS — S82209A Unspecified fracture of shaft of unspecified tibia, initial encounter for closed fracture: Secondary | ICD-10-CM | POA: Diagnosis not present

## 2015-11-27 DIAGNOSIS — M5412 Radiculopathy, cervical region: Secondary | ICD-10-CM | POA: Diagnosis not present

## 2015-11-27 DIAGNOSIS — G8929 Other chronic pain: Secondary | ICD-10-CM | POA: Diagnosis not present

## 2015-11-27 MED ORDER — CYCLOBENZAPRINE HCL 10 MG PO TABS: 10.0000 mg | ORAL_TABLET | Freq: Three times a day (TID) | ORAL | 5 refills | Status: DC | PRN

## 2015-11-27 MED ORDER — TRAMADOL HCL 50 MG PO TABS: 100.0000 mg | ORAL_TABLET | Freq: Three times a day (TID) | ORAL | 5 refills | Status: DC | PRN

## 2015-11-27 NOTE — Progress Notes (Signed)
Safety precautions to be maintained throughout the outpatient stay will include: orient to surroundings, keep bed in low position, maintain call bell within reach at all times, provide assistance with transfer out of bed and ambulation.  Tramadol 50 mg #138 out of 240 remaining. Filled 11-08-15.
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
## 2015-11-27 NOTE — Patient Instructions (Signed)
A prescription for FLEXERIL was sent to your pharmacy and should be available for pickup today.  A prescription for TRAMADOL was given to you today.

## 2015-12-06 ENCOUNTER — Other Ambulatory Visit: Payer: Self-pay | Admitting: Pain Medicine

## 2015-12-06 DIAGNOSIS — G8929 Other chronic pain: Secondary | ICD-10-CM

## 2015-12-27 ENCOUNTER — Encounter: Payer: Medicare Other | Admitting: Pain Medicine

## 2016-03-18 ENCOUNTER — Other Ambulatory Visit: Payer: Self-pay | Admitting: Pain Medicine

## 2016-03-18 DIAGNOSIS — G8929 Other chronic pain: Secondary | ICD-10-CM

## 2016-03-19 ENCOUNTER — Telehealth: Payer: Self-pay | Admitting: *Deleted

## 2016-03-19 NOTE — Telephone Encounter (Signed)
Patient states pharmacy wont fill her Tramadol until they hear from our office. Contacted [facility], they said they have not received a prescription from Ms. Eick since 08/2015. Last one was written 11-27-15, with 5 refills. Explained to Ms. Gibeault that Dr. Laban Emperor cannot write a new prescription because the one written 11-27-15 was lost.

## 2016-05-23 ENCOUNTER — Encounter: Payer: Self-pay | Admitting: Pain Medicine

## 2016-05-23 ENCOUNTER — Ambulatory Visit: Payer: Medicare Other | Attending: Pain Medicine | Admitting: Pain Medicine

## 2016-05-23 ENCOUNTER — Encounter (INDEPENDENT_AMBULATORY_CARE_PROVIDER_SITE_OTHER): Payer: Self-pay

## 2016-05-23 VITALS — BP 149/80 | HR 106 | Temp 98.3°F | Resp 18 | Ht 62.0 in | Wt 135.0 lb

## 2016-05-23 DIAGNOSIS — M79604 Pain in right leg: Secondary | ICD-10-CM | POA: Diagnosis not present

## 2016-05-23 DIAGNOSIS — M810 Age-related osteoporosis without current pathological fracture: Secondary | ICD-10-CM | POA: Diagnosis not present

## 2016-05-23 DIAGNOSIS — I10 Essential (primary) hypertension: Secondary | ICD-10-CM | POA: Diagnosis not present

## 2016-05-23 DIAGNOSIS — Z5181 Encounter for therapeutic drug level monitoring: Secondary | ICD-10-CM | POA: Insufficient documentation

## 2016-05-23 DIAGNOSIS — M5416 Radiculopathy, lumbar region: Secondary | ICD-10-CM | POA: Diagnosis not present

## 2016-05-23 DIAGNOSIS — M1611 Unilateral primary osteoarthritis, right hip: Secondary | ICD-10-CM | POA: Insufficient documentation

## 2016-05-23 DIAGNOSIS — M5441 Lumbago with sciatica, right side: Secondary | ICD-10-CM

## 2016-05-23 DIAGNOSIS — Z79891 Long term (current) use of opiate analgesic: Secondary | ICD-10-CM | POA: Insufficient documentation

## 2016-05-23 DIAGNOSIS — F1020 Alcohol dependence, uncomplicated: Secondary | ICD-10-CM | POA: Insufficient documentation

## 2016-05-23 DIAGNOSIS — F1190 Opioid use, unspecified, uncomplicated: Secondary | ICD-10-CM

## 2016-05-23 DIAGNOSIS — M4722 Other spondylosis with radiculopathy, cervical region: Secondary | ICD-10-CM | POA: Insufficient documentation

## 2016-05-23 DIAGNOSIS — G8929 Other chronic pain: Secondary | ICD-10-CM | POA: Diagnosis not present

## 2016-05-23 DIAGNOSIS — M47812 Spondylosis without myelopathy or radiculopathy, cervical region: Secondary | ICD-10-CM | POA: Diagnosis not present

## 2016-05-23 DIAGNOSIS — M4726 Other spondylosis with radiculopathy, lumbar region: Secondary | ICD-10-CM | POA: Diagnosis not present

## 2016-05-23 DIAGNOSIS — E785 Hyperlipidemia, unspecified: Secondary | ICD-10-CM | POA: Diagnosis not present

## 2016-05-23 DIAGNOSIS — M549 Dorsalgia, unspecified: Secondary | ICD-10-CM | POA: Diagnosis present

## 2016-05-23 DIAGNOSIS — M542 Cervicalgia: Secondary | ICD-10-CM | POA: Diagnosis not present

## 2016-05-23 DIAGNOSIS — N812 Incomplete uterovaginal prolapse: Secondary | ICD-10-CM | POA: Diagnosis not present

## 2016-05-23 DIAGNOSIS — F4310 Post-traumatic stress disorder, unspecified: Secondary | ICD-10-CM | POA: Diagnosis not present

## 2016-05-23 DIAGNOSIS — Z8249 Family history of ischemic heart disease and other diseases of the circulatory system: Secondary | ICD-10-CM | POA: Diagnosis not present

## 2016-05-23 DIAGNOSIS — Z882 Allergy status to sulfonamides status: Secondary | ICD-10-CM | POA: Insufficient documentation

## 2016-05-23 DIAGNOSIS — F329 Major depressive disorder, single episode, unspecified: Secondary | ICD-10-CM | POA: Insufficient documentation

## 2016-05-23 DIAGNOSIS — N3946 Mixed incontinence: Secondary | ICD-10-CM | POA: Diagnosis not present

## 2016-05-23 DIAGNOSIS — Z87828 Personal history of other (healed) physical injury and trauma: Secondary | ICD-10-CM | POA: Insufficient documentation

## 2016-05-23 DIAGNOSIS — G894 Chronic pain syndrome: Secondary | ICD-10-CM | POA: Diagnosis not present

## 2016-05-23 DIAGNOSIS — M791 Myalgia: Secondary | ICD-10-CM | POA: Insufficient documentation

## 2016-05-23 DIAGNOSIS — Z79899 Other long term (current) drug therapy: Secondary | ICD-10-CM | POA: Diagnosis not present

## 2016-05-23 DIAGNOSIS — F17200 Nicotine dependence, unspecified, uncomplicated: Secondary | ICD-10-CM | POA: Diagnosis not present

## 2016-05-23 DIAGNOSIS — M7918 Myalgia, other site: Secondary | ICD-10-CM

## 2016-05-23 DIAGNOSIS — F411 Generalized anxiety disorder: Secondary | ICD-10-CM | POA: Insufficient documentation

## 2016-05-23 MED ORDER — TRAMADOL HCL 50 MG PO TABS: 100.0000 mg | ORAL_TABLET | Freq: Three times a day (TID) | ORAL | 5 refills | Status: DC

## 2016-05-23 MED ORDER — CYCLOBENZAPRINE HCL 10 MG PO TABS: 10.0000 mg | ORAL_TABLET | Freq: Three times a day (TID) | ORAL | 5 refills | Status: DC

## 2016-05-23 NOTE — Progress Notes (Signed)
Nursing Pain Medication Assessment:  Safety precautions to be maintained throughout the outpatient stay will include: orient to surroundings, keep bed in low position, maintain call bell within reach at all times, provide assistance with transfer out of bed and ambulation.  Medication Inspection Compliance: Pill count conducted under aseptic conditions, in front of the patient. Neither the pills nor the bottle was removed from the patient's sight at any time. Once count was completed pills were immediately returned to the patient in their original bottle.  Medication: Tramadol (Ultram) Pill/Patch Count: 141 of 180 pills remain Bottle Appearance: Standard pharmacy container. Clearly labeled. Filled Date: 02 / 06 / 2018 Last Medication intake:  Today
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
## 2016-05-23 NOTE — Patient Instructions (Addendum)
You received a prescription today for Tramadol with 5 refills.  You have procedures ordered if you need one, just call the office.  You have Flexeril at the pharmacy that you can pick up.

## 2016-05-27 ENCOUNTER — Encounter: Payer: Medicare Other | Admitting: Pain Medicine

## 2016-05-30 LAB — TOXASSURE SELECT 13 (MW), URINE

## 2016-06-10 ENCOUNTER — Other Ambulatory Visit: Payer: Self-pay | Admitting: Pain Medicine

## 2016-06-10 DIAGNOSIS — G894 Chronic pain syndrome: Secondary | ICD-10-CM

## 2016-06-13 ENCOUNTER — Other Ambulatory Visit: Payer: Self-pay | Admitting: Pain Medicine

## 2016-06-13 DIAGNOSIS — M7918 Myalgia, other site: Secondary | ICD-10-CM

## 2016-08-27 ENCOUNTER — Emergency Department
Admission: EM | Admit: 2016-08-27 | Discharge: 2016-08-27 | Disposition: A | Discharge status: Home / Self Care | Payer: Medicare Other | Attending: Emergency Medicine | Admitting: Emergency Medicine

## 2016-08-27 ENCOUNTER — Encounter: Payer: Self-pay | Admitting: Emergency Medicine

## 2016-08-27 DIAGNOSIS — I10 Essential (primary) hypertension: Secondary | ICD-10-CM

## 2016-08-27 DIAGNOSIS — Z79899 Other long term (current) drug therapy: Secondary | ICD-10-CM | POA: Diagnosis not present

## 2016-08-27 DIAGNOSIS — F17200 Nicotine dependence, unspecified, uncomplicated: Secondary | ICD-10-CM | POA: Insufficient documentation

## 2016-08-27 DIAGNOSIS — R102 Pelvic and perineal pain: Secondary | ICD-10-CM | POA: Insufficient documentation

## 2016-08-27 LAB — CBC WITH DIFFERENTIAL/PLATELET
Basophils Absolute: 0.1 10*3/uL (ref 0–0.1)
Basophils Relative: 1 %
EOS ABS: 0.2 10*3/uL (ref 0–0.7)
EOS PCT: 2 %
HCT: 34.1 % — ABNORMAL LOW (ref 35.0–47.0)
HEMOGLOBIN: 12 g/dL (ref 12.0–16.0)
LYMPHS ABS: 2.2 10*3/uL (ref 1.0–3.6)
Lymphocytes Relative: 21 %
MCH: 31.3 pg (ref 26.0–34.0)
MCHC: 35.2 g/dL (ref 32.0–36.0)
MCV: 89 fL (ref 80.0–100.0)
MONOS PCT: 7 %
Monocytes Absolute: 0.7 10*3/uL (ref 0.2–0.9)
Neutro Abs: 7.2 10*3/uL — ABNORMAL HIGH (ref 1.4–6.5)
Neutrophils Relative %: 69 %
PLATELETS: 378 10*3/uL (ref 150–440)
RBC: 3.83 MIL/uL (ref 3.80–5.20)
RDW: 13.3 % (ref 11.5–14.5)
WBC: 10.5 10*3/uL (ref 3.6–11.0)

## 2016-08-27 LAB — BASIC METABOLIC PANEL
ANION GAP: 8 (ref 5–15)
BUN: 9 mg/dL (ref 6–20)
CO2: 25 mmol/L (ref 22–32)
Calcium: 9.3 mg/dL (ref 8.9–10.3)
Chloride: 95 mmol/L — ABNORMAL LOW (ref 101–111)
Creatinine, Ser: 0.86 mg/dL (ref 0.44–1.00)
GFR calc Af Amer: >60 mL/min (ref >60)
GLUCOSE: 98 mg/dL (ref 65–99)
Potassium: 4.5 mmol/L (ref 3.5–5.1)
SODIUM: 128 mmol/L — AB (ref 135–145)

## 2016-08-27 LAB — WET PREP, GENITAL
Clue Cells Wet Prep HPF POC: NONE SEEN
SPERM: NONE SEEN
Trich, Wet Prep: NONE SEEN
YEAST WET PREP: NONE SEEN

## 2016-08-27 LAB — CHLAMYDIA/NGC RT PCR (ARMC ONLY)
Chlamydia Tr: NOT DETECTED
N GONORRHOEAE: NOT DETECTED

## 2016-08-27 LAB — URINALYSIS, COMPLETE (UACMP) WITH MICROSCOPIC
Bacteria, UA: NONE SEEN
Bilirubin Urine: NEGATIVE
GLUCOSE, UA: NEGATIVE mg/dL
Hgb urine dipstick: NEGATIVE
Ketones, ur: NEGATIVE mg/dL
Nitrite: NEGATIVE
Protein, ur: NEGATIVE mg/dL
RBC / HPF: NONE SEEN RBC/hpf (ref 0–5)
Specific Gravity, Urine: 1.008 (ref 1.005–1.030)
pH: 7 (ref 5.0–8.0)

## 2016-08-27 NOTE — ED Triage Notes (Signed)
C/O prolapsed bladder, and describes a plastic piece to hold bladder up.  Patient c/o pelvic pain.  Describes intermittent urinary urgency.

## 2016-08-27 NOTE — ED Notes (Signed)
Pelvic cart at bedside. Awaiting dr. Darnelle Catalan.
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
## 2016-08-27 NOTE — Discharge Instructions (Signed)
Please follow-up with Dr. Jean Rosenthal. Call today for an appointment in the next few days as he discussed with you. Please return here for fever severe pain or vomiting or feeling sicker. Remember he does not want you to have the pessary in for several days until everything heals some.
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
## 2016-08-27 NOTE — ED Notes (Signed)
Pressary removed by OB MD. Placed in bag and given to pt. Pt states more comfortable since removal.

## 2016-08-27 NOTE — ED Notes (Signed)
Pt states "I think I have a problem with my lady parts." states bladder problem. denies urinary symptoms. Appears uncomfortable. States feeling this way x week. States "my bladder was hanging out of my insides. In [place] they put a round piece of plastic to hold my bladder inside me." states this was 4 years ago. Denies vaginal discharge. States sometimes there is blood on toilet paper but denies any today.

## 2016-09-13 ENCOUNTER — Encounter: Payer: Self-pay | Admitting: Obstetrics and Gynecology

## 2016-09-13 ENCOUNTER — Ambulatory Visit (INDEPENDENT_AMBULATORY_CARE_PROVIDER_SITE_OTHER): Payer: Medicare Other | Admitting: Obstetrics and Gynecology

## 2016-09-13 VITALS — BP 126/74 | Ht 62.0 in | Wt 132.0 lb

## 2016-09-13 DIAGNOSIS — N8111 Cystocele, midline: Secondary | ICD-10-CM | POA: Diagnosis not present

## 2016-09-13 DIAGNOSIS — N819 Female genital prolapse, unspecified: Secondary | ICD-10-CM | POA: Diagnosis not present

## 2016-09-13 NOTE — Patient Instructions (Signed)
You may contact [facility] or I am happy to refer you.  [facility] [address] [place]. [place], [place] [address] Appointment Line  [phone] Nurse Line  [phone]
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
## 2016-12-04 ENCOUNTER — Other Ambulatory Visit: Payer: Self-pay | Admitting: Pain Medicine

## 2016-12-04 DIAGNOSIS — G894 Chronic pain syndrome: Secondary | ICD-10-CM

## 2016-12-04 DIAGNOSIS — M7918 Myalgia, other site: Secondary | ICD-10-CM

## 2016-12-16 ENCOUNTER — Encounter: Payer: Self-pay | Admitting: Nurse Practitioner

## 2016-12-16 ENCOUNTER — Ambulatory Visit: Payer: Medicare Other | Attending: Nurse Practitioner | Admitting: Nurse Practitioner

## 2016-12-16 VITALS — BP 129/81 | HR 102 | Temp 98.3°F | Resp 16 | Ht 62.0 in | Wt 125.0 lb

## 2016-12-16 DIAGNOSIS — M5416 Radiculopathy, lumbar region: Secondary | ICD-10-CM

## 2016-12-16 DIAGNOSIS — Z79899 Other long term (current) drug therapy: Secondary | ICD-10-CM | POA: Diagnosis not present

## 2016-12-16 DIAGNOSIS — M47816 Spondylosis without myelopathy or radiculopathy, lumbar region: Secondary | ICD-10-CM | POA: Diagnosis not present

## 2016-12-16 DIAGNOSIS — M1611 Unilateral primary osteoarthritis, right hip: Secondary | ICD-10-CM | POA: Insufficient documentation

## 2016-12-16 DIAGNOSIS — Z5181 Encounter for therapeutic drug level monitoring: Secondary | ICD-10-CM | POA: Insufficient documentation

## 2016-12-16 DIAGNOSIS — F411 Generalized anxiety disorder: Secondary | ICD-10-CM | POA: Diagnosis not present

## 2016-12-16 DIAGNOSIS — I341 Nonrheumatic mitral (valve) prolapse: Secondary | ICD-10-CM | POA: Diagnosis not present

## 2016-12-16 DIAGNOSIS — Z79891 Long term (current) use of opiate analgesic: Secondary | ICD-10-CM | POA: Insufficient documentation

## 2016-12-16 DIAGNOSIS — M791 Myalgia: Secondary | ICD-10-CM | POA: Insufficient documentation

## 2016-12-16 DIAGNOSIS — F4310 Post-traumatic stress disorder, unspecified: Secondary | ICD-10-CM | POA: Diagnosis not present

## 2016-12-16 DIAGNOSIS — M4722 Other spondylosis with radiculopathy, cervical region: Secondary | ICD-10-CM | POA: Insufficient documentation

## 2016-12-16 DIAGNOSIS — M79604 Pain in right leg: Secondary | ICD-10-CM | POA: Diagnosis not present

## 2016-12-16 DIAGNOSIS — G894 Chronic pain syndrome: Secondary | ICD-10-CM | POA: Diagnosis not present

## 2016-12-16 DIAGNOSIS — M25551 Pain in right hip: Secondary | ICD-10-CM | POA: Diagnosis not present

## 2016-12-16 DIAGNOSIS — I10 Essential (primary) hypertension: Secondary | ICD-10-CM | POA: Insufficient documentation

## 2016-12-16 DIAGNOSIS — N814 Uterovaginal prolapse, unspecified: Secondary | ICD-10-CM | POA: Insufficient documentation

## 2016-12-16 DIAGNOSIS — F329 Major depressive disorder, single episode, unspecified: Secondary | ICD-10-CM | POA: Diagnosis not present

## 2016-12-16 DIAGNOSIS — G8929 Other chronic pain: Secondary | ICD-10-CM

## 2016-12-16 DIAGNOSIS — M7918 Myalgia, other site: Secondary | ICD-10-CM

## 2016-12-16 MED ORDER — TRAMADOL HCL 50 MG PO TABS: 100.0000 mg | ORAL_TABLET | Freq: Three times a day (TID) | ORAL | 5 refills | Status: DC

## 2016-12-16 MED ORDER — CYCLOBENZAPRINE HCL 10 MG PO TABS: 10.0000 mg | ORAL_TABLET | Freq: Three times a day (TID) | ORAL | 5 refills | Status: DC

## 2016-12-16 NOTE — Progress Notes (Signed)
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 medications you take. 7. Pain Medication from other Providers: You are not allowed to accept any additional pain medication from any other Doctor or Healthcare provider. There are two exceptions to this rule. (see below) In the event that you require additional pain medication, you are responsible for notifying us, as stated below. 8. Medication Agreement:  You are responsible for carefully reading and following our Medication Agreement. This must be signed before receiving any prescriptions from our practice. Safely store a copy of your signed Agreement. Violations to the Agreement will result in no further prescriptions. (Additional copies of our Medication Agreement are available upon request.) 9. Laws, Rules, & Regulations: All patients are expected to follow all Federal and [employer], [employer], Rules, [employer]. Ignorance of the Laws does not constitute a valid excuse. The use of any illegal  substances is prohibited. 10. Adopted CDC guidelines & recommendations: Target dosing levels will be at or below 60 MME/day. Use of benzodiazepines** is not recommended.  Exceptions: There are only two exceptions to the rule of not receiving pain medications from other Healthcare Providers. 1. Exception #1 (Emergencies): In the event of an emergency (i.e.: accident requiring emergency care), you are allowed to receive additional pain medication. However, you are responsible for: As soon as you are able, call our office [phone], at any time of the day or night, and leave a message stating your name, the date and nature of the emergency, and the name and dose of the medication prescribed. In the event that your call is answered by a member of our staff, make sure to document and save the date, time, and the name of the person that took your information.  2. Exception #2 (Planned Surgery): In the event that you are scheduled by another doctor or dentist to have any type of surgery or procedure, you are allowed (for a period no longer than 30 days), to receive additional pain medication, for the acute post-op pain. However, in this case, you are responsible for picking up a copy of our "Post-op Pain Management for Surgeons" handout, and giving it to your surgeon or dentist. This document is available at our office, and does not require an appointment to obtain it. Simply go to our office during business hours (Monday-Thursday from 8:00 AM to 4:00 PM) (Friday 8:00 AM to 12:00 Noon) or if you have a scheduled appointment with [place], prior to your surgery, and ask for it by name. In addition, you will need to provide [place] with your name, name of your surgeon, type of surgery, and date of procedure or surgery.  *Opioid medications include: morphine, codeine, oxycodone, oxymorphone, hydrocodone, hydromorphone, meperidine, tramadol, tapentadol, buprenorphine, fentanyl, methadone. **Benzodiazepine medications  include: diazepam (Valium), alprazolam (Xanax), clonazepam (Klonopine), lorazepam (Ativan), clorazepate (Tranxene), chlordiazepoxide (Librium), estazolam (Prosom), oxazepam (Serax), temazepam (Restoril), triazolam (Halcion)  ____________________________________________________________________________________________  BMI Assessment: Estimated body mass index is 22.86 kg/m as calculated from the following:   Height as of this encounter: '5\' 2"'  (1.575 m).   Weight as of this encounter: 125 lb (56.7 kg).  BMI interpretation table: BMI level Category Range association with higher incidence of chronic pain  <18 kg/m2 Underweight   18.5-24.9 kg/m2 Ideal body weight   25-29.9 kg/m2 Overweight Increased incidence by 20%  30-34.9 kg/m2 Obese (Class I) Increased incidence by 68%  35-39.9 kg/m2 Severe obesity (Class II) Increased incidence by 136%  >40 kg/m2 Extreme obesity (Class III) Increased incidence by 254%   BMI Readings from Last 4 Encounters:  12/16/16 22.86 kg/m  09/13/16 24.14 kg/m  08/27/16 24.69 kg/m  05/23/16 24.69 kg/m   Wt Readings from Last 4 Encounters:  12/16/16 125 lb (56.7 kg)  09/13/16 132 lb (59.9 kg)  08/27/16 135 lb (61.2 kg)  05/23/16 135 lb (61.2 kg)  Pain Management Discharge Instructions  General Discharge Instructions :  If you need to reach your doctor call: Monday-Friday 8:00 am - 4:00 pm at [phone] or toll free [phone].  After clinic hours [phone] to have operator reach doctor.  Bring all of your medication bottles to all your appointments in the pain clinic.  To cancel or reschedule your appointment with Pain Management please remember to call 24 hours in advance to avoid a fee.  Refer to the educational materials which you have been given on: General Risks, I had my Procedure. Discharge Instructions, Post Sedation.  Post Procedure Instructions:  The drugs you were given will stay in your system until tomorrow, so for the next 24  hours you should not drive, make any legal decisions or drink any alcoholic beverages.  You may eat anything you prefer, but it is better to start with liquids then soups and crackers, and gradually work up to solid foods.  Please notify your doctor immediately if you have any unusual bleeding, trouble breathing or pain that is not related to your normal pain.  Depending on the type of procedure that was done, some parts of your body may feel week and/or numb.  This usually clears up by tonight or the next day.  Walk with the use of an assistive device or accompanied by an adult for the 24 hours.  You may use ice on the affected area for the first 24 hours.  Put ice in a Ziploc bag and cover with a towel and place against area 15 minutes on 15 minutes off.  You may switch to heat after 24 hours.

## 2016-12-16 NOTE — Progress Notes (Signed)
Nursing Pain Medication Assessment:  Safety precautions to be maintained throughout the outpatient stay will include: orient to surroundings, keep bed in low position, maintain call bell within reach at all times, provide assistance with transfer out of bed and ambulation.  Medication Inspection Compliance: Pill count conducted under aseptic conditions, in front of the patient. Neither the pills nor the bottle was removed from the patient's sight at any time. Once count was completed pills were immediately returned to the patient in their original bottle.  Medication: Tramadol (Ultram) Pill/Patch Count: 2 of 180 pills remain Pill/Patch Appearance: Markings consistent with prescribed medication Bottle Appearance: Standard pharmacy container. Clearly labeled. Filled Date: 07 / 28 / 2018 Last Medication intake:  Today

## 2016-12-16 NOTE — Patient Instructions (Addendum)
____________________________________________________________________________________________  Medication Rules  Applies to: All patients receiving prescriptions (written or electronic).  Pharmacy of record: Pharmacy where electronic prescriptions will be sent. If written prescriptions are taken to a different pharmacy, please inform the nursing staff. The pharmacy listed in the electronic medical record should be the one where you would like electronic prescriptions to be sent.  Prescription refills: Only during scheduled appointments. Applies to both, written and electronic prescriptions.  NOTE: The following applies primarily to controlled substances (Opioid* Pain Medications).   Patient's responsibilities: 1. Pain Pills: Bring all pain pills to every appointment (except for procedure appointments). 2. Pill Bottles: Bring pills in original pharmacy bottle. Always bring newest bottle. Bring bottle, even if empty. 3. Medication refills: You are responsible for knowing and keeping track of what medications you need refilled. The day before your appointment, write a list of all prescriptions that need to be refilled. Bring that list to your appointment and give it to the admitting nurse. Prescriptions will be written only during appointments. If you forget a medication, it will not be "Called in", "Faxed", or "electronically sent". You will need to get another appointment to get these prescribed. 4. Prescription Accuracy: You are responsible for carefully inspecting your prescriptions before leaving our office. Have the discharge nurse carefully go over each prescription with you, before taking them home. Make sure that your name is accurately spelled, that your address is correct. Check the name and dose of your medication to make sure it is accurate. Check the number of pills, and the written instructions to make sure they are clear and accurate. Make sure that you are given enough medication to  last until your next medication refill appointment. 5. Taking Medication: Take medication as prescribed. Never take more pills than instructed. Never take medication more frequently than prescribed. Taking less pills or less frequently is permitted and encouraged, when it comes to controlled substances (written prescriptions).  6. Inform other Doctors: Always inform, all of your healthcare providers, of all the medications you take. 7. Pain Medication from other Providers: You are not allowed to accept any additional pain medication from any other Doctor or Healthcare provider. There are two exceptions to this rule. (see below) In the event that you require additional pain medication, you are responsible for notifying us, as stated below. 8. Medication Agreement: You are responsible for carefully reading and following our Medication Agreement. This must be signed before receiving any prescriptions from our practice. Safely store a copy of your signed Agreement. Violations to the Agreement will result in no further prescriptions. (Additional copies of our Medication Agreement are available upon request.) 9. Laws, Rules, & Regulations: All patients are expected to follow all Federal and State Laws, Statutes, Rules, & Regulations. Ignorance of the Laws does not constitute a valid excuse. The use of any illegal substances is prohibited. 10. Adopted CDC guidelines & recommendations: Target dosing levels will be at or below 60 MME/day. Use of benzodiazepines** is not recommended.  Exceptions: There are only two exceptions to the rule of not receiving pain medications from other Healthcare Providers. 1. Exception #1 (Emergencies): In the event of an emergency (i.e.: accident requiring emergency care), you are allowed to receive additional pain medication. However, you are responsible for: As soon as you are able, call our office [phone], at any time of the day or night, and leave a message stating your  name, the date and nature of the emergency, and the name and dose of the medication   prescribed. In the event that your call is answered by a member of our staff, make sure to document and save the date, time, and the name of the person that took your information.  2. Exception #2 (Planned Surgery): In the event that you are scheduled by another doctor or dentist to have any type of surgery or procedure, you are allowed (for a period no longer than 30 days), to receive additional pain medication, for the acute post-op pain. However, in this case, you are responsible for picking up a copy of our "Post-op Pain Management for Surgeons" handout, and giving it to your surgeon or dentist. This document is available at our office, and does not require an appointment to obtain it. Simply go to our office during business hours (Monday-Thursday from 8:00 AM to 4:00 PM) (Friday 8:00 AM to 12:00 Noon) or if you have a scheduled appointment with [place], prior to your surgery, and ask for it by name. In addition, you will need to provide [place] with your name, name of your surgeon, type of surgery, and date of procedure or surgery.  *Opioid medications include: morphine, codeine, oxycodone, oxymorphone, hydrocodone, hydromorphone, meperidine, tramadol, tapentadol, buprenorphine, fentanyl, methadone. **Benzodiazepine medications include: diazepam (Valium), alprazolam (Xanax), clonazepam (Klonopine), lorazepam (Ativan), clorazepate (Tranxene), chlordiazepoxide (Librium), estazolam (Prosom), oxazepam (Serax), temazepam (Restoril), triazolam (Halcion)  ____________________________________________________________________________________________  BMI Assessment: Estimated body mass index is 22.86 kg/m as calculated from the following:   Height as of this encounter: 5\' 2"  (1.575 m).   Weight as of this encounter: 125 lb (56.7 kg).  BMI interpretation table: BMI level Category Range association with higher incidence of chronic pain   <18 kg/m2 Underweight   18.5-24.9 kg/m2 Ideal body weight   25-29.9 kg/m2 Overweight Increased incidence by 20%  30-34.9 kg/m2 Obese (Class I) Increased incidence by 68%  35-39.9 kg/m2 Severe obesity (Class II) Increased incidence by 136%  >40 kg/m2 Extreme obesity (Class III) Increased incidence by 254%   BMI Readings from Last 4 Encounters:  12/16/16 22.86 kg/m  09/13/16 24.14 kg/m  08/27/16 24.69 kg/m  05/23/16 24.69 kg/m   Wt Readings from Last 4 Encounters:  12/16/16 125 lb (56.7 kg)  09/13/16 132 lb (59.9 kg)  08/27/16 135 lb (61.2 kg)  05/23/16 135 lb (61.2 kg)  Pain Management Discharge Instructions  General Discharge Instructions :  If you need to reach your doctor call: Monday-Friday 8:00 am - 4:00 pm at [phone] or toll free [phone].  After clinic hours [phone] to have operator reach doctor.  Bring all of your medication bottles to all your appointments in the pain clinic.  To cancel or reschedule your appointment with Pain Management please remember to call 24 hours in advance to avoid a fee.  Refer to the educational materials which you have been given on: General Risks, I had my Procedure. Discharge Instructions, Post Sedation.  Post Procedure Instructions:  The drugs you were given will stay in your system until tomorrow, so for the next 24 hours you should not drive, make any legal decisions or drink any alcoholic beverages.  You may eat anything you prefer, but it is better to start with liquids then soups and crackers, and gradually work up to solid foods.  Please notify your doctor immediately if you have any unusual bleeding, trouble breathing or pain that is not related to your normal pain.  Depending on the type of procedure that was done, some parts of your body may feel week and/or numb.  This usually  clears up by tonight or the next day.  Walk with the use of an assistive device or accompanied by an adult for the 24 hours.  You  may use ice on the affected area for the first 24 hours.  Put ice in a Ziploc bag and cover with a towel and place against area 15 minutes on 15 minutes off.  You may switch to heat after 24 hours.

## 2016-12-20 LAB — TOXASSURE SELECT 13 (MW), URINE

## 2017-06-02 ENCOUNTER — Ambulatory Visit: Payer: Medicare Other | Attending: Nurse Practitioner | Admitting: Nurse Practitioner

## 2017-06-02 ENCOUNTER — Other Ambulatory Visit: Payer: Self-pay

## 2017-06-02 ENCOUNTER — Encounter: Payer: Self-pay | Admitting: Nurse Practitioner

## 2017-06-02 VITALS — BP 118/71 | HR 97 | Temp 99.1°F | Resp 16 | Ht 62.0 in | Wt 135.0 lb

## 2017-06-02 DIAGNOSIS — M4856XD Collapsed vertebra, not elsewhere classified, lumbar region, subsequent encounter for fracture with routine healing: Secondary | ICD-10-CM | POA: Insufficient documentation

## 2017-06-02 DIAGNOSIS — N8110 Cystocele, unspecified: Secondary | ICD-10-CM | POA: Diagnosis not present

## 2017-06-02 DIAGNOSIS — R7982 Elevated C-reactive protein (CRP): Secondary | ICD-10-CM | POA: Insufficient documentation

## 2017-06-02 DIAGNOSIS — M4722 Other spondylosis with radiculopathy, cervical region: Secondary | ICD-10-CM | POA: Diagnosis not present

## 2017-06-02 DIAGNOSIS — F1020 Alcohol dependence, uncomplicated: Secondary | ICD-10-CM | POA: Diagnosis not present

## 2017-06-02 DIAGNOSIS — I341 Nonrheumatic mitral (valve) prolapse: Secondary | ICD-10-CM | POA: Diagnosis not present

## 2017-06-02 DIAGNOSIS — E785 Hyperlipidemia, unspecified: Secondary | ICD-10-CM | POA: Diagnosis not present

## 2017-06-02 DIAGNOSIS — M7918 Myalgia, other site: Secondary | ICD-10-CM

## 2017-06-02 DIAGNOSIS — M542 Cervicalgia: Secondary | ICD-10-CM | POA: Diagnosis present

## 2017-06-02 DIAGNOSIS — I10 Essential (primary) hypertension: Secondary | ICD-10-CM | POA: Diagnosis not present

## 2017-06-02 DIAGNOSIS — M25551 Pain in right hip: Secondary | ICD-10-CM | POA: Insufficient documentation

## 2017-06-02 DIAGNOSIS — F329 Major depressive disorder, single episode, unspecified: Secondary | ICD-10-CM | POA: Insufficient documentation

## 2017-06-02 DIAGNOSIS — M5416 Radiculopathy, lumbar region: Secondary | ICD-10-CM | POA: Diagnosis not present

## 2017-06-02 DIAGNOSIS — Z79891 Long term (current) use of opiate analgesic: Secondary | ICD-10-CM | POA: Insufficient documentation

## 2017-06-02 DIAGNOSIS — F411 Generalized anxiety disorder: Secondary | ICD-10-CM | POA: Diagnosis not present

## 2017-06-02 DIAGNOSIS — Z79899 Other long term (current) drug therapy: Secondary | ICD-10-CM | POA: Insufficient documentation

## 2017-06-02 DIAGNOSIS — M4726 Other spondylosis with radiculopathy, lumbar region: Secondary | ICD-10-CM | POA: Diagnosis not present

## 2017-06-02 DIAGNOSIS — N3946 Mixed incontinence: Secondary | ICD-10-CM | POA: Insufficient documentation

## 2017-06-02 DIAGNOSIS — M47812 Spondylosis without myelopathy or radiculopathy, cervical region: Secondary | ICD-10-CM | POA: Diagnosis not present

## 2017-06-02 DIAGNOSIS — M810 Age-related osteoporosis without current pathological fracture: Secondary | ICD-10-CM | POA: Insufficient documentation

## 2017-06-02 DIAGNOSIS — G8929 Other chronic pain: Secondary | ICD-10-CM

## 2017-06-02 DIAGNOSIS — G894 Chronic pain syndrome: Secondary | ICD-10-CM | POA: Diagnosis not present

## 2017-06-02 DIAGNOSIS — M545 Low back pain: Secondary | ICD-10-CM | POA: Diagnosis present

## 2017-06-02 MED ORDER — TRAMADOL HCL 50 MG PO TABS: 100.0000 mg | ORAL_TABLET | Freq: Three times a day (TID) | ORAL | 5 refills | Status: DC

## 2017-06-02 MED ORDER — CYCLOBENZAPRINE HCL 10 MG PO TABS: 10.0000 mg | ORAL_TABLET | Freq: Three times a day (TID) | ORAL | 5 refills | Status: DC

## 2017-06-02 NOTE — Progress Notes (Signed)
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 prescriptions from our practice. Safely store a copy of your signed Agreement. Violations to the Agreement will result in no further prescriptions. (Additional copies of our Medication Agreement are available upon request.) 9. Laws, Rules, & Regulations: All patients are expected to follow all Federal and [employer], [employer], Rules, [employer]. Ignorance of the Laws does not constitute a valid excuse. The use of any illegal substances is prohibited. 10. Adopted CDC guidelines & recommendations: Target  dosing levels will be at or below 60 MME/day. Use of benzodiazepines** is not recommended.  Exceptions: There are only two exceptions to the rule of not receiving pain medications from other Healthcare Providers. 1. Exception #1 (Emergencies): In the event of an emergency (i.e.: accident requiring emergency care), you are allowed to receive additional pain medication. However, you are responsible for: As soon as you are  able, call our office [phone], at any time of the day or night, and leave a message stating your name, the date and nature of the emergency, and the name and dose of the medication prescribed. In the event that your call is answered by a member of our staff, make sure to document and save the date, time, and the name of the person that took your information.  2. Exception #2 (Planned Surgery): In the event that you are scheduled by another doctor or dentist to have any type of surgery or procedure, you are allowed (for a period no longer than 30 days), to receive additional pain medication, for the acute post-op pain. However, in this case, you are responsible for picking up a copy of our "Post-op Pain Management for Surgeons" handout, and giving it to your surgeon or dentist. This document is available at our office, and does not require an appointment to obtain it. Simply go to our office during business hours (Monday-Thursday from 8:00 AM to 4:00 PM) (Friday 8:00 AM to 12:00 Noon) or if you have a scheduled appointment with [place], prior to your surgery, and ask for it by name. In addition, you will need to provide [place] with your name, name of your surgeon, type of surgery, and date of procedure or surgery.  *Opioid medications include: morphine, codeine, oxycodone, oxymorphone, hydrocodone, hydromorphone, meperidine, tramadol, tapentadol, buprenorphine, fentanyl, methadone. **Benzodiazepine medications include: diazepam (Valium), alprazolam (Xanax), clonazepam (Klonopine), lorazepam (Ativan), clorazepate (Tranxene), chlordiazepoxide (Librium), estazolam (Prosom), oxazepam (Serax), temazepam (Restoril), triazolam (Halcion) ____________________________________________________________________________________________  ____________________________________________________________________________________________  Pain Scale  Introduction: The pain score used by this practice is the Verbal Numerical Rating  Scale (VNRS-11). This is an 11-point scale. It is for adults and children 10 years or older. There are significant differences in how the pain score is reported, used, and applied. Forget everything you learned in the past and learn this scoring system.  General Information: The scale should reflect your current level of pain. Unless you are specifically asked for the level of your worst pain, or your average pain. If you are asked for one of these two, then it should be understood that it is over the past 24 hours.  Basic Activities of Daily Living (ADL): Personal hygiene, dressing, eating, transferring, and using restroom.  Instructions: Most patients tend to report their level of pain as a combination of two factors, their physical pain and their psychosocial pain. This last one is also known as "suffering" and it is reflection of how physical pain affects you socially and psychologically. From now on, report them separately. From this point on, when asked to report your pain level, report only your physical pain. Use the following table for reference.  Pain Clinic Pain Levels (0-5/10)  Pain Level Score  Description  No Pain 0   Mild pain 1 Nagging, annoying, but does not interfere with basic activities of daily living (ADL). Patients are able to eat, bathe, get dressed, toileting (being able to get on and off the toilet and perform personal hygiene functions), transfer (move in and out of bed or a  chair without assistance), and maintain continence (able to control bladder and bowel functions). Blood pressure and heart rate are unaffected. A normal heart rate for a healthy adult ranges from 60 to 100 bpm (beats per minute).   Mild to moderate pain 2 Noticeable and distracting. Impossible to hide from other people. More frequent flare-ups. Still possible to adapt and function close to normal. It can be very annoying and may have occasional stronger flare-ups. With discipline, patients may get used to  it and adapt.   Moderate pain 3 Interferes significantly with activities of daily living (ADL). It becomes difficult to feed, bathe, get dressed, get on and off the toilet or to perform personal hygiene functions. Difficult to get in and out of bed or a chair without assistance. Very distracting. With effort, it can be ignored when deeply involved in activities.   Moderately severe pain 4 Impossible to ignore for more than a few minutes. With effort, patients may still be able to manage work or participate in some social activities. Very difficult to concentrate. Signs of autonomic nervous system discharge are evident: dilated pupils (mydriasis); mild sweating (diaphoresis); sleep interference. Heart rate becomes elevated (>115 bpm). Diastolic blood pressure (lower number) rises above 100 mmHg. Patients find relief in laying down and not moving.   Severe pain 5 Intense and extremely unpleasant. Associated with frowning face and frequent crying. Pain overwhelms the senses.  Ability to do any activity or maintain social relationships becomes significantly limited. Conversation becomes difficult. Pacing back and forth is common, as getting into a comfortable position is nearly impossible. Pain wakes you up from deep sleep. Physical signs will be obvious: pupillary dilation; increased sweating; goosebumps; brisk reflexes; cold, clammy hands and feet; nausea, vomiting or dry heaves; loss of appetite; significant sleep disturbance with inability to fall asleep or to remain asleep. When persistent, significant weight loss is observed due to the complete loss of appetite and sleep deprivation.  Blood pressure and heart rate becomes significantly elevated. Caution: If elevated blood pressure triggers a pounding headache associated with blurred vision, then the patient should immediately seek attention at an urgent or emergency care unit, as these may be signs of an impending stroke.    Emergency Department Pain  Levels (6-10/10)  Emergency Room Pain 6 Severely limiting. Requires emergency care and should not be seen or managed at an outpatient pain management facility. Communication becomes difficult and requires great effort. Assistance to reach the emergency department may be required. Facial flushing and profuse sweating along with potentially dangerous increases in heart rate and blood pressure will be evident.   Distressing pain 7 Self-care is very difficult. Assistance is required to transport, or use restroom. Assistance to reach the emergency department will be required. Tasks requiring coordination, such as bathing and getting dressed become very difficult.   Disabling pain 8 Self-care is no longer possible. At this level, pain is disabling. The individual is unable to do even the most "basic" activities such as walking, eating, bathing, dressing, transferring to a bed, or toileting. Fine motor skills are lost. It is difficult to think clearly.   Incapacitating pain 9 Pain becomes incapacitating. Thought processing is no longer possible. Difficult to remember your own name. Control of movement and coordination are lost.   The worst pain imaginable 10 At this level, most patients pass out from pain. When this level is reached, collapse of the autonomic nervous system occurs, leading to a sudden drop in blood pressure and heart rate. This  in turn results in a temporary and dramatic drop in blood flow to the brain, leading to a loss of consciousness. Fainting is one of the body's self defense mechanisms. Passing out puts the brain in a calmed state and causes it to shut down for a while, in order to begin the healing process.    Summary: 1. Refer to this scale when providing [place] with your pain level. 2. Be accurate and careful when reporting your pain level. This will help with your care. 3. Over-reporting your pain level will lead to loss of credibility. 4. Even a level of 1/10 means that there is pain  and will be treated at our facility. 5. High, inaccurate reporting will be documented as "Symptom Exaggeration", leading to loss of credibility and suspicions of possible secondary gains such as obtaining more narcotics, or wanting to appear disabled, for fraudulent reasons. 6. Only pain levels of 5 or below will be seen at our facility. 7. Pain levels of 6 and above will be sent to the Emergency Department and the appointment cancelled. ____________________________________________________________________________________________

## 2017-06-02 NOTE — Patient Instructions (Addendum)

## 2017-06-02 NOTE — Progress Notes (Signed)
Nursing Pain Medication Assessment:  Safety precautions to be maintained throughout the outpatient stay will include: orient to surroundings, keep bed in low position, maintain call bell within reach at all times, provide assistance with transfer out of bed and ambulation.  Medication Inspection Compliance: Pill count conducted under aseptic conditions, in front of the patient. Neither the pills nor the bottle was removed from the patient's sight at any time. Once count was completed pills were immediately returned to the patient in their original bottle.  Medication #1: Cyclobenzaprine Pill/Patch Count: 66 of 90 pills remain Pill/Patch Appearance: Markings consistent with prescribed medication Bottle Appearance: Standard pharmacy container. Clearly labeled. Filled Date: 2 / 14 / 2019 Last Medication intake:  Today  Medication #2: Tramadol (Ultram) Pill/Patch Count: 155 of 180 pills remain Pill/Patch Appearance: Markings consistent with prescribed medication Bottle Appearance: Standard pharmacy container. Clearly labeled. Filled Date: 02 / 14 / 2019 Last Medication intake:  Today

## 2017-09-29 ENCOUNTER — Encounter: Payer: Self-pay | Admitting: Obstetrics and Gynecology

## 2017-09-29 ENCOUNTER — Ambulatory Visit (INDEPENDENT_AMBULATORY_CARE_PROVIDER_SITE_OTHER): Payer: Medicare Other | Admitting: Obstetrics and Gynecology

## 2017-09-29 VITALS — BP 152/86 | HR 104 | Ht 62.0 in | Wt 134.0 lb

## 2017-09-29 DIAGNOSIS — N813 Complete uterovaginal prolapse: Secondary | ICD-10-CM | POA: Diagnosis not present

## 2017-09-29 DIAGNOSIS — N8111 Cystocele, midline: Secondary | ICD-10-CM | POA: Diagnosis not present

## 2017-09-29 DIAGNOSIS — N819 Female genital prolapse, unspecified: Secondary | ICD-10-CM
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
## 2017-11-05 ENCOUNTER — Telehealth: Payer: Self-pay | Admitting: Obstetrics and Gynecology

## 2017-11-05 NOTE — Telephone Encounter (Signed)
Please advise 

## 2017-11-05 NOTE — Telephone Encounter (Signed)
Patient wants to know if her pessary has been ordered.  If so we need to schedule her appointment.

## 2017-11-12 ENCOUNTER — Other Ambulatory Visit: Payer: Self-pay | Admitting: Internal Medicine

## 2017-11-12 ENCOUNTER — Telehealth: Payer: Self-pay

## 2017-11-12 DIAGNOSIS — Z1231 Encounter for screening mammogram for malignant neoplasm of breast: Secondary | ICD-10-CM

## 2017-11-12 NOTE — Telephone Encounter (Signed)
Ellen Fleming from [facility] is calling stating Ellen Fleming is wanting to know information about her Pessary that was suppose to be order 3-4 weeks ago and she would like someone to call her and let her know what is going on in regards to that. Thank you

## 2017-11-13 NOTE — Telephone Encounter (Signed)
LMVM to notify pt her device (pessary) is scheduled to be delivered by the first of next week. She can call to schedule her apt for placement at her convenience/contact us with any further questions/concerns.

## 2017-11-14 NOTE — Telephone Encounter (Signed)
Patient is schedule 8/16/ 19 with [facility] for pessary placement

## 2017-11-19 NOTE — Telephone Encounter (Signed)
Pessary arrived 11/18/17. Labeled & given to SDJ for pt apt.

## 2017-11-21 ENCOUNTER — Ambulatory Visit (INDEPENDENT_AMBULATORY_CARE_PROVIDER_SITE_OTHER): Payer: Medicare Other | Admitting: Obstetrics and Gynecology

## 2017-11-21 ENCOUNTER — Encounter: Payer: Self-pay | Admitting: Obstetrics and Gynecology

## 2017-11-21 VITALS — BP 122/78 | HR 113 | Ht 62.0 in | Wt 132.0 lb

## 2017-11-21 DIAGNOSIS — N813 Complete uterovaginal prolapse: Secondary | ICD-10-CM | POA: Diagnosis not present

## 2017-11-21 DIAGNOSIS — N8111 Cystocele, midline: Secondary | ICD-10-CM

## 2017-11-21 NOTE — Progress Notes (Signed)
    Pessary Fitting Patient presents for a pessary fitting. She desires a pessary as her means of controlling her symptoms of prolapse and/or urinary incontinence. She understands the care needed for a pessary and desires to proceed. Alternative treatment options have been discussed at length and the patient voices an understanding of each option.   PROCEDURE: The patient was placed in dorsal lithotomy position. Examination confirmed prolapse. She has a stage V vaginal prolapse (status post hysterectomy).  Tissue coated with Estrace.  A 7 incontinence ring with support pessary was fitted without difficulty. The patient subsequently ambulated, voided and performed valsalva maneuvers without dislodging the pessary and without discomfort. Care instructions were provided. Patient was discharged to home in stable condition.   Return in about 4 weeks (around 12/19/2017) for pessary check.  Thomasene Mohair, MD, [facility], [facility] 11/21/2017 2:59 PM

## 2017-11-24 ENCOUNTER — Other Ambulatory Visit: Payer: Self-pay

## 2017-11-24 ENCOUNTER — Ambulatory Visit: Payer: Medicare Other | Attending: Nurse Practitioner | Admitting: Nurse Practitioner

## 2017-11-24 ENCOUNTER — Encounter: Payer: Self-pay | Admitting: Nurse Practitioner

## 2017-11-24 VITALS — BP 129/76 | HR 92 | Temp 98.2°F | Ht 62.0 in | Wt 130.0 lb

## 2017-11-24 DIAGNOSIS — Z8249 Family history of ischemic heart disease and other diseases of the circulatory system: Secondary | ICD-10-CM | POA: Diagnosis not present

## 2017-11-24 DIAGNOSIS — N3946 Mixed incontinence: Secondary | ICD-10-CM | POA: Insufficient documentation

## 2017-11-24 DIAGNOSIS — I341 Nonrheumatic mitral (valve) prolapse: Secondary | ICD-10-CM | POA: Insufficient documentation

## 2017-11-24 DIAGNOSIS — Z780 Asymptomatic menopausal state: Secondary | ICD-10-CM | POA: Insufficient documentation

## 2017-11-24 DIAGNOSIS — M7918 Myalgia, other site: Secondary | ICD-10-CM | POA: Insufficient documentation

## 2017-11-24 DIAGNOSIS — M4722 Other spondylosis with radiculopathy, cervical region: Secondary | ICD-10-CM | POA: Insufficient documentation

## 2017-11-24 DIAGNOSIS — G894 Chronic pain syndrome: Secondary | ICD-10-CM | POA: Insufficient documentation

## 2017-11-24 DIAGNOSIS — M47816 Spondylosis without myelopathy or radiculopathy, lumbar region: Secondary | ICD-10-CM | POA: Diagnosis not present

## 2017-11-24 DIAGNOSIS — Z79891 Long term (current) use of opiate analgesic: Secondary | ICD-10-CM

## 2017-11-24 DIAGNOSIS — Z882 Allergy status to sulfonamides status: Secondary | ICD-10-CM | POA: Diagnosis not present

## 2017-11-24 DIAGNOSIS — M5412 Radiculopathy, cervical region: Secondary | ICD-10-CM | POA: Diagnosis not present

## 2017-11-24 DIAGNOSIS — E785 Hyperlipidemia, unspecified: Secondary | ICD-10-CM | POA: Insufficient documentation

## 2017-11-24 DIAGNOSIS — F329 Major depressive disorder, single episode, unspecified: Secondary | ICD-10-CM | POA: Insufficient documentation

## 2017-11-24 DIAGNOSIS — F411 Generalized anxiety disorder: Secondary | ICD-10-CM | POA: Insufficient documentation

## 2017-11-24 DIAGNOSIS — Z79899 Other long term (current) drug therapy: Secondary | ICD-10-CM | POA: Diagnosis not present

## 2017-11-24 DIAGNOSIS — Z760 Encounter for issue of repeat prescription: Secondary | ICD-10-CM | POA: Insufficient documentation

## 2017-11-24 DIAGNOSIS — F1120 Opioid dependence, uncomplicated: Secondary | ICD-10-CM | POA: Insufficient documentation

## 2017-11-24 DIAGNOSIS — G8929 Other chronic pain: Secondary | ICD-10-CM

## 2017-11-24 MED ORDER — CYCLOBENZAPRINE HCL 10 MG PO TABS: 10.0000 mg | ORAL_TABLET | Freq: Three times a day (TID) | ORAL | 5 refills | Status: DC

## 2017-11-24 MED ORDER — TRAMADOL HCL 50 MG PO TABS: 100.0000 mg | ORAL_TABLET | Freq: Three times a day (TID) | ORAL | 5 refills | Status: DC

## 2017-11-24 NOTE — Progress Notes (Signed)
Nursing Pain Medication Assessment:  Safety precautions to be maintained throughout the outpatient stay will include: orient to surroundings, keep bed in low position, maintain call bell within reach at all times, provide assistance with transfer out of bed and ambulation.  Medication Inspection Compliance: Pill count conducted under aseptic conditions, in front of the patient. Neither the pills nor the bottle was removed from the patient's sight at any time. Once count was completed pills were immediately returned to the patient in their original bottle.  Medication: Tramadol (Ultram) Pill/Patch Count: 149 of 180 pills remain Pill/Patch Appearance: Markings consistent with prescribed medication Bottle Appearance: Standard pharmacy container. Clearly labeled. Filled Date: 8 / 8 / 2019 Last Medication intake:  Today

## 2017-11-24 NOTE — Patient Instructions (Signed)
____________________________________________________________________________________________  Medication Rules  Applies to: All patients receiving prescriptions (written or electronic).  Pharmacy of record: Pharmacy where electronic prescriptions will be sent. If written prescriptions are taken to a different pharmacy, please inform the nursing staff. The pharmacy listed in the electronic medical record should be the one where you would like electronic prescriptions to be sent.  Prescription refills: Only during scheduled appointments. Applies to both, written and electronic prescriptions.  NOTE: The following applies primarily to controlled substances (Opioid* Pain Medications).   Patient's responsibilities: 1. Pain Pills: Bring all pain pills to every appointment (except for procedure appointments). 2. Pill Bottles: Bring pills in original pharmacy bottle. Always bring newest bottle. Bring bottle, even if empty. 3. Medication refills: You are responsible for knowing and keeping track of what medications you need refilled. The day before your appointment, write a list of all prescriptions that need to be refilled. Bring that list to your appointment and give it to the admitting nurse. Prescriptions will be written only during appointments. If you forget a medication, it will not be "Called in", "Faxed", or "electronically sent". You will need to get another appointment to get these prescribed. 4. Prescription Accuracy: You are responsible for carefully inspecting your prescriptions before leaving our office. Have the discharge nurse carefully go over each prescription with you, before taking them home. Make sure that your name is accurately spelled, that your address is correct. Check the name and dose of your medication to make sure it is accurate. Check the number of pills, and the written instructions to make sure they are clear and accurate. Make sure that you are given enough medication to last  until your next medication refill appointment. 5. Taking Medication: Take medication as prescribed. Never take more pills than instructed. Never take medication more frequently than prescribed. Taking less pills or less frequently is permitted and encouraged, when it comes to controlled substances (written prescriptions).  6. Inform other Doctors: Always inform, all of your healthcare providers, of all the medications you take. 7. Pain Medication from other Providers: You are not allowed to accept any additional pain medication from any other Doctor or Healthcare provider. There are two exceptions to this rule. (see below) In the event that you require additional pain medication, you are responsible for notifying us, as stated below. 8. Medication Agreement: You are responsible for carefully reading and following our Medication Agreement. This must be signed before receiving any prescriptions from our practice. Safely store a copy of your signed Agreement. Violations to the Agreement will result in no further prescriptions. (Additional copies of our Medication Agreement are available upon request.) 9. Laws, Rules, & Regulations: All patients are expected to follow all Federal and State Laws, Statutes, Rules, & Regulations. Ignorance of the Laws does not constitute a valid excuse. The use of any illegal substances is prohibited. 10. Adopted CDC guidelines & recommendations: Target dosing levels will be at or below 60 MME/day. Use of benzodiazepines** is not recommended.  Exceptions: There are only two exceptions to the rule of not receiving pain medications from other Healthcare Providers. 1. Exception #1 (Emergencies): In the event of an emergency (i.e.: accident requiring emergency care), you are allowed to receive additional pain medication. However, you are responsible for: As soon as you are able, call our office [phone], at any time of the day or night, and leave a message stating your name, the  date and nature of the emergency, and the name and dose of the medication   prescribed. In the event that your call is answered by a member of our staff, make sure to document and save the date, time, and the name of the person that took your information.  2. Exception #2 (Planned Surgery): In the event that you are scheduled by another doctor or dentist to have any type of surgery or procedure, you are allowed (for a period no longer than 30 days), to receive additional pain medication, for the acute post-op pain. However, in this case, you are responsible for picking up a copy of our "Post-op Pain Management for Surgeons" handout, and giving it to your surgeon or dentist. This document is available at our office, and does not require an appointment to obtain it. Simply go to our office during business hours (Monday-Thursday from 8:00 AM to 4:00 PM) (Friday 8:00 AM to 12:00 Noon) or if you have a scheduled appointment with us, prior to your surgery, and ask for it by name. In addition, you will need to provide us with your name, name of your surgeon, type of surgery, and date of procedure or surgery.  *Opioid medications include: morphine, codeine, oxycodone, oxymorphone, hydrocodone, hydromorphone, meperidine, tramadol, tapentadol, buprenorphine, fentanyl, methadone. **Benzodiazepine medications include: diazepam (Valium), alprazolam (Xanax), clonazepam (Klonopine), lorazepam (Ativan), clorazepate (Tranxene), chlordiazepoxide (Librium), estazolam (Prosom), oxazepam (Serax), temazepam (Restoril), triazolam (Halcion) (Last updated: 06/05/2017) ____________________________________________________________________________________________    

## 2017-11-24 NOTE — Progress Notes (Signed)
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 [employer], Rules, [employer]. Ignorance of the Laws does not constitute a valid excuse. The use of any illegal substances is prohibited. 10. Adopted CDC guidelines & recommendations: Target dosing levels will be at or below 60 MME/day. Use of benzodiazepines** is not recommended.  Exceptions: There are only two exceptions to the rule of not receiving pain medications from other Healthcare Providers. 1. Exception #1 (Emergencies): In the event of an emergency (i.e.: accident requiring emergency care), you are allowed to receive additional pain medication. However, you are responsible for: As soon as you are able, call our office [phone], at any time of the day or night, and leave a message stating your name, the date and nature of the emergency, and the name and dose of the medication prescribed. In the event that your call is answered by a member of our staff, make sure to  document and save the date, time, and the name of the person that took your information.  2. Exception #2 (Planned Surgery): In the event that you are scheduled by another doctor or dentist to have any type of surgery or procedure, you are allowed (for a period no longer than 30 days), to receive additional pain medication, for the acute post-op pain. However, in this case, you are responsible for picking up a copy of our "Post-op Pain Management for Surgeons" handout, and giving it to your surgeon or dentist. This document is available at our office, and does not require an appointment to obtain it. Simply go to our office during business hours (Monday-Thursday from 8:00 AM to 4:00 PM) (Friday 8:00 AM to 12:00 Noon) or if you have a scheduled appointment with [place], prior to your surgery, and ask for it by name. In addition, you will need to provide [place] with your name, name of your surgeon, type of surgery, and date of procedure or surgery.  *Opioid medications include: morphine, codeine, oxycodone, oxymorphone, hydrocodone, hydromorphone, meperidine, tramadol, tapentadol, buprenorphine, fentanyl, methadone. **Benzodiazepine medications include: diazepam (Valium), alprazolam (Xanax), clonazepam (Klonopine), lorazepam (Ativan), clorazepate (Tranxene), chlordiazepoxide (Librium), estazolam (Prosom), oxazepam (Serax), temazepam (Restoril), triazolam (Halcion) (Last updated: 06/05/2017) ____________________________________________________________________________________________

## 2017-11-27 LAB — TOXASSURE SELECT 13 (MW), URINE

## 2017-11-30 ENCOUNTER — Other Ambulatory Visit: Payer: Self-pay

## 2017-11-30 ENCOUNTER — Ambulatory Visit
Admission: EM | Admit: 2017-11-30 | Discharge: 2017-11-30 | Disposition: A | Discharge status: Home / Self Care | Payer: Medicare Other | Attending: Family Medicine | Admitting: Family Medicine

## 2017-11-30 ENCOUNTER — Ambulatory Visit (INDEPENDENT_AMBULATORY_CARE_PROVIDER_SITE_OTHER): Payer: Medicare Other

## 2017-11-30 DIAGNOSIS — J181 Lobar pneumonia, unspecified organism: Secondary | ICD-10-CM

## 2017-11-30 DIAGNOSIS — J189 Pneumonia, unspecified organism: Secondary | ICD-10-CM

## 2017-11-30 HISTORY — DX: Chronic pain syndrome: G89.4

## 2017-11-30 LAB — RAPID INFLUENZA A&B ANTIGENS (ARMC ONLY)
INFLUENZA A (ARMC): NEGATIVE
INFLUENZA B (ARMC): NEGATIVE

## 2017-11-30 MED ORDER — IPRATROPIUM-ALBUTEROL 0.5-2.5 (3) MG/3ML IN SOLN
3.0000 mL | Freq: Once | RESPIRATORY_TRACT | Status: AC
  Administered 2017-11-30: 3 mL via RESPIRATORY_TRACT

## 2017-11-30 MED ORDER — LEVOFLOXACIN 750 MG PO TABS: 750.0000 mg | ORAL_TABLET | Freq: Every day | ORAL | 0 refills | Status: AC

## 2017-11-30 NOTE — Discharge Instructions (Signed)
Antibiotic as prescribed.  If you worsen, go to the hospital.  Take care  Dr. Adriana Simas
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
## 2017-11-30 NOTE — ED Triage Notes (Signed)
Pt with left sided neck pain, back pain and pain in legs starting on Friday. Pt had a headache and mild sore throat last night, but took Tylenol and denies pain to throat or head currently. Musculoskeletal pain 8/10

## 2017-12-04 ENCOUNTER — Emergency Department
Admission: EM | Admit: 2017-12-04 | Discharge: 2017-12-04 | Disposition: A | Discharge status: Home / Self Care | Payer: Medicare Other | Attending: Emergency Medicine | Admitting: Emergency Medicine

## 2017-12-04 ENCOUNTER — Other Ambulatory Visit: Payer: Self-pay

## 2017-12-04 ENCOUNTER — Encounter: Payer: Self-pay | Admitting: *Deleted

## 2017-12-04 ENCOUNTER — Emergency Department: Payer: Medicare Other

## 2017-12-04 DIAGNOSIS — J189 Pneumonia, unspecified organism: Secondary | ICD-10-CM | POA: Diagnosis not present

## 2017-12-04 DIAGNOSIS — Z79899 Other long term (current) drug therapy: Secondary | ICD-10-CM | POA: Insufficient documentation

## 2017-12-04 DIAGNOSIS — F1120 Opioid dependence, uncomplicated: Secondary | ICD-10-CM | POA: Diagnosis not present

## 2017-12-04 DIAGNOSIS — F419 Anxiety disorder, unspecified: Secondary | ICD-10-CM | POA: Insufficient documentation

## 2017-12-04 DIAGNOSIS — F329 Major depressive disorder, single episode, unspecified: Secondary | ICD-10-CM | POA: Diagnosis not present

## 2017-12-04 DIAGNOSIS — R0600 Dyspnea, unspecified: Secondary | ICD-10-CM

## 2017-12-04 DIAGNOSIS — Z720 Tobacco use: Secondary | ICD-10-CM

## 2017-12-04 DIAGNOSIS — F17210 Nicotine dependence, cigarettes, uncomplicated: Secondary | ICD-10-CM | POA: Insufficient documentation

## 2017-12-04 DIAGNOSIS — R0602 Shortness of breath: Secondary | ICD-10-CM | POA: Diagnosis present

## 2017-12-04 DIAGNOSIS — I10 Essential (primary) hypertension: Secondary | ICD-10-CM | POA: Diagnosis not present

## 2017-12-04 LAB — BASIC METABOLIC PANEL
Anion gap: 12 (ref 5–15)
BUN: 11 mg/dL (ref 8–23)
CHLORIDE: 90 mmol/L — AB (ref 98–111)
CO2: 27 mmol/L (ref 22–32)
Calcium: 8.9 mg/dL (ref 8.9–10.3)
Creatinine, Ser: 0.91 mg/dL (ref 0.44–1.00)
GFR calc Af Amer: >60 mL/min (ref >60)
GFR calc non Af Amer: >60 mL/min (ref >60)
GLUCOSE: 103 mg/dL — AB (ref 70–99)
Potassium: 3.7 mmol/L (ref 3.5–5.1)
Sodium: 129 mmol/L — ABNORMAL LOW (ref 135–145)

## 2017-12-04 LAB — BRAIN NATRIURETIC PEPTIDE: B Natriuretic Peptide: 56 pg/mL (ref 0.0–100.0)

## 2017-12-04 LAB — CBC
HEMATOCRIT: 30.7 % — AB (ref 35.0–47.0)
Hemoglobin: 10.7 g/dL — ABNORMAL LOW (ref 12.0–16.0)
MCH: 31.5 pg (ref 26.0–34.0)
MCHC: 35 g/dL (ref 32.0–36.0)
MCV: 90.1 fL (ref 80.0–100.0)
PLATELETS: 347 10*3/uL (ref 150–440)
RBC: 3.41 MIL/uL — ABNORMAL LOW (ref 3.80–5.20)
RDW: 13.7 % (ref 11.5–14.5)
WBC: 9.9 10*3/uL (ref 3.6–11.0)

## 2017-12-04 LAB — TROPONIN I: Troponin I: <0.03 ng/mL (ref <0.03)

## 2017-12-04 MED ORDER — DEXAMETHASONE SODIUM PHOSPHATE 10 MG/ML IJ SOLN
10.0000 mg | Freq: Once | INTRAMUSCULAR | Status: AC
  Administered 2017-12-04: 10 mg via INTRAVENOUS
  Filled 2017-12-04: qty 1

## 2017-12-04 MED ORDER — IPRATROPIUM-ALBUTEROL 0.5-2.5 (3) MG/3ML IN SOLN
3.0000 mL | Freq: Once | RESPIRATORY_TRACT | Status: AC
  Administered 2017-12-04: 3 mL via RESPIRATORY_TRACT
  Filled 2017-12-04: qty 3

## 2017-12-04 MED ORDER — IOPAMIDOL (ISOVUE-370) INJECTION 76%
75.0000 mL | Freq: Once | INTRAVENOUS | Status: AC | PRN
  Administered 2017-12-04: 75 mL via INTRAVENOUS

## 2017-12-04 MED ORDER — SODIUM CHLORIDE 0.9 % IV BOLUS
500.0000 mL | Freq: Once | INTRAVENOUS | Status: AC
  Administered 2017-12-04: 500 mL via INTRAVENOUS

## 2017-12-04 MED ORDER — ALBUTEROL SULFATE HFA 108 (90 BASE) MCG/ACT IN AERS: 2.0000 | INHALATION_SPRAY | Freq: Four times a day (QID) | RESPIRATORY_TRACT | 2 refills | Status: DC | PRN

## 2017-12-04 NOTE — ED Notes (Signed)

## 2017-12-04 NOTE — ED Triage Notes (Signed)
PT to ED reporting increased SOB and dizziness while ambulating for the past week. PT was dx with pneumonia on Sunday and placed on PO Levaquin without improvement. NO fevers.

## 2017-12-04 NOTE — ED Triage Notes (Signed)
First nurse:  Sent from [facility].  Started on levaquin Sunday for pneumonia.  Still short of breath.

## 2017-12-04 NOTE — Discharge Instructions (Addendum)
You would prefer to go home at this time which is not unreasonable however, it does mean that if you feel worse you need to come back.  We will give you dose of steroids here which hopefully will help your cough, I will also send you home with a prescription for some albuterol in case he do not have sufficient at home.  You have shortness of breath, fever, chills, vomiting, or you feel worse in any way return to the emergency department.  Stop smoking.

## 2017-12-04 NOTE — ED Notes (Signed)
Pt ambulated in hallway approx. 100 ft with continuous pulse ox. Sat >94% entire time. Pt denies SOB. EDP [place] notified.
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
## 2017-12-22 ENCOUNTER — Encounter: Payer: Self-pay | Admitting: Obstetrics and Gynecology

## 2017-12-22 ENCOUNTER — Ambulatory Visit (INDEPENDENT_AMBULATORY_CARE_PROVIDER_SITE_OTHER): Payer: Medicare Other | Admitting: Obstetrics and Gynecology

## 2017-12-22 VITALS — BP 122/74 | Wt 127.0 lb

## 2017-12-22 DIAGNOSIS — N813 Complete uterovaginal prolapse: Secondary | ICD-10-CM

## 2017-12-22 DIAGNOSIS — N8111 Cystocele, midline: Secondary | ICD-10-CM | POA: Diagnosis not present
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
## 2018-01-05 ENCOUNTER — Other Ambulatory Visit: Payer: Self-pay | Admitting: Nurse Practitioner

## 2018-01-05 DIAGNOSIS — G894 Chronic pain syndrome: Secondary | ICD-10-CM

## 2018-03-23 ENCOUNTER — Ambulatory Visit: Payer: Medicare Other | Admitting: Obstetrics and Gynecology

## 2018-03-26 ENCOUNTER — Encounter: Payer: Self-pay | Admitting: Obstetrics and Gynecology

## 2018-03-26 ENCOUNTER — Ambulatory Visit (INDEPENDENT_AMBULATORY_CARE_PROVIDER_SITE_OTHER): Payer: Medicare Other | Admitting: Obstetrics and Gynecology

## 2018-03-26 VITALS — BP 128/84 | Ht 62.0 in | Wt 127.0 lb

## 2018-03-26 DIAGNOSIS — N8111 Cystocele, midline: Secondary | ICD-10-CM | POA: Diagnosis not present

## 2018-03-26 DIAGNOSIS — N814 Uterovaginal prolapse, unspecified: Secondary | ICD-10-CM

## 2018-03-26 DIAGNOSIS — N813 Complete uterovaginal prolapse: Secondary | ICD-10-CM | POA: Diagnosis not present
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
## 2018-05-27 ENCOUNTER — Ambulatory Visit: Payer: Medicare Other | Attending: Nurse Practitioner | Admitting: Nurse Practitioner

## 2018-05-27 ENCOUNTER — Encounter: Payer: Self-pay | Admitting: Nurse Practitioner

## 2018-05-27 VITALS — BP 138/80 | HR 97 | Temp 98.5°F | Resp 16 | Ht 62.0 in | Wt 127.0 lb

## 2018-05-27 DIAGNOSIS — M7918 Myalgia, other site: Secondary | ICD-10-CM | POA: Insufficient documentation

## 2018-05-27 DIAGNOSIS — G894 Chronic pain syndrome: Secondary | ICD-10-CM | POA: Diagnosis present

## 2018-05-27 DIAGNOSIS — M47812 Spondylosis without myelopathy or radiculopathy, cervical region: Secondary | ICD-10-CM | POA: Insufficient documentation

## 2018-05-27 DIAGNOSIS — M47816 Spondylosis without myelopathy or radiculopathy, lumbar region: Secondary | ICD-10-CM | POA: Diagnosis present

## 2018-05-27 MED ORDER — CYCLOBENZAPRINE HCL 10 MG PO TABS: 10.0000 mg | ORAL_TABLET | Freq: Three times a day (TID) | ORAL | 5 refills | Status: DC

## 2018-05-27 MED ORDER — TRAMADOL HCL 50 MG PO TABS: 100.0000 mg | ORAL_TABLET | Freq: Three times a day (TID) | ORAL | 5 refills | Status: DC

## 2018-05-27 NOTE — Progress Notes (Signed)
Nursing Pain Medication Assessment:  Safety precautions to be maintained throughout the outpatient stay will include: orient to surroundings, keep bed in low position, maintain call bell within reach at all times, provide assistance with transfer out of bed and ambulation.  Medication Inspection Compliance: Pill count conducted under aseptic conditions, in front of the patient. Neither the pills nor the bottle was removed from the patient's sight at any time. Once count was completed pills were immediately returned to the patient in their original bottle.  Medication: Tramadol (Ultram) Pill/Patch Count: 26 of 180 pills remain Pill/Patch Appearance: Markings consistent with prescribed medication Bottle Appearance: Standard pharmacy container. Clearly labeled. Filled Date: 01 / 22 / 2020 Last Medication intake:  Today
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
## 2018-05-27 NOTE — Patient Instructions (Addendum)
____________________________________________________________________________________________  Medication Rules  Purpose: To inform patients, and their family members, of our rules and regulations.  Applies to: All patients receiving prescriptions (written or electronic).  Pharmacy of record: Pharmacy where electronic prescriptions will be sent. If written prescriptions are taken to a different pharmacy, please inform the nursing staff. The pharmacy listed in the electronic medical record should be the one where you would like electronic prescriptions to be sent.  Electronic prescriptions: In compliance with the [place] Strengthen Opioid Misuse Prevention (STOP) Act of 2017 (Session Law 2017-74/H243), effective April 08, 2018, all controlled substances must be electronically prescribed. Calling prescriptions to the pharmacy will cease to exist.  Prescription refills: Only during scheduled appointments. Applies to all prescriptions.  NOTE: The following applies primarily to controlled substances (Opioid* Pain Medications).   Patient's responsibilities: 1. Pain Pills: Bring all pain pills to every appointment (except for procedure appointments). 2. Pill Bottles: Bring pills in original pharmacy bottle. Always bring the newest bottle. Bring bottle, even if empty. 3. Medication refills: You are responsible for knowing and keeping track of what medications you take and those you need refilled. The day before your appointment: write a list of all prescriptions that need to be refilled. The day of the appointment: give the list to the admitting nurse. Prescriptions will be written only during appointments. No prescriptions will be written on procedure days. If you forget a medication: it will not be "Called in", "Faxed", or "electronically sent". You will need to get another appointment to get these prescribed. No early refills. Do not call asking to have your prescription filled  early. 4. Prescription Accuracy: You are responsible for carefully inspecting your prescriptions before leaving our office. Have the discharge nurse carefully go over each prescription with you, before taking them home. Make sure that your name is accurately spelled, that your address is correct. Check the name and dose of your medication to make sure it is accurate. Check the number of pills, and the written instructions to make sure they are clear and accurate. Make sure that you are given enough medication to last until your next medication refill appointment. 5. Taking Medication: Take medication as prescribed. When it comes to controlled substances, taking less pills or less frequently than prescribed is permitted and encouraged. Never take more pills than instructed. Never take medication more frequently than prescribed.  6. Inform other Doctors: Always inform, all of your healthcare providers, of all the medications you take. 7. Pain Medication from other Providers: You are not allowed to accept any additional pain medication from any other Doctor or Healthcare provider. There are two exceptions to this rule. (see below) In the event that you require additional pain medication, you are responsible for notifying us, as stated below. 8. Medication Agreement: You are responsible for carefully reading and following our Medication Agreement. This must be signed before receiving any prescriptions from our practice. Safely store a copy of your signed Agreement. Violations to the Agreement will result in no further prescriptions. (Additional copies of our Medication Agreement are available upon request.) 9. Laws, Rules, & Regulations: All patients are expected to follow all Federal and State Laws, Statutes, Rules, & Regulations. Ignorance of the Laws does not constitute a valid excuse. The use of any illegal substances is prohibited. 10. Adopted CDC guidelines & recommendations: Target dosing levels will be  at or below 60 MME/day. Use of benzodiazepines** is not recommended.  Exceptions: There are only two exceptions to the rule of not   receiving pain medications from other Healthcare Providers. 1. Exception #1 (Emergencies): In the event of an emergency (i.e.: accident requiring emergency care), you are allowed to receive additional pain medication. However, you are responsible for: As soon as you are able, call our office [phone], at any time of the day or night, and leave a message stating your name, the date and nature of the emergency, and the name and dose of the medication prescribed. In the event that your call is answered by a member of our staff, make sure to document and save the date, time, and the name of the person that took your information.  2. Exception #2 (Planned Surgery): In the event that you are scheduled by another doctor or dentist to have any type of surgery or procedure, you are allowed (for a period no longer than 30 days), to receive additional pain medication, for the acute post-op pain. However, in this case, you are responsible for picking up a copy of our "Post-op Pain Management for Surgeons" handout, and giving it to your surgeon or dentist. This document is available at our office, and does not require an appointment to obtain it. Simply go to our office during business hours (Monday-Thursday from 8:00 AM to 4:00 PM) (Friday 8:00 AM to 12:00 Noon) or if you have a scheduled appointment with us, prior to your surgery, and ask for it by name. In addition, you will need to provide us with your name, name of your surgeon, type of surgery, and date of procedure or surgery.  *Opioid medications include: morphine, codeine, oxycodone, oxymorphone, hydrocodone, hydromorphone, meperidine, tramadol, tapentadol, buprenorphine, fentanyl, methadone. **Benzodiazepine medications include: diazepam (Valium), alprazolam (Xanax), clonazepam (Klonopine), lorazepam (Ativan), clorazepate  (Tranxene), chlordiazepoxide (Librium), estazolam (Prosom), oxazepam (Serax), temazepam (Restoril), triazolam (Halcion) (Last updated: 06/05/2017) ____________________________________________________________________________________________   ____________________________________________________________________________________________  General Risks and Possible Complications  Patient Responsibilities: It is important that you read this as it is part of your informed consent. It is our duty to inform you of the risks and possible complications associated with treatments offered to you. It is your responsibility as a patient to read this and to ask questions about anything that is not clear or that you believe was not covered in this document.  Patient's Rights: You have the right to refuse treatment. You also have the right to change your mind, even after initially having agreed to have the treatment done. However, under this last option, if you wait until the last second to change your mind, you may be charged for the materials used up to that point.  Introduction: Medicine is not an exact science. Everything in Medicine, including the lack of treatment(s), carries the potential for danger, harm, or loss (which is by definition: Risk). In Medicine, a complication is a secondary problem, condition, or disease that can aggravate an already existing one. All treatments carry the risk of possible complications. The fact that a side effects or complications occurs, does not imply that the treatment was conducted incorrectly. It must be clearly understood that these can happen even when everything is done following the highest safety standards.  No treatment: You can choose not to proceed with the proposed treatment alternative. The "PRO(s)" would include: avoiding the risk of complications associated with the therapy. The "CON(s)" would include: not getting any of the treatment benefits. These benefits fall  under one of three categories: diagnostic; therapeutic; and/or palliative. Diagnostic benefits include: getting information which can ultimately lead to improvement of the disease or   symptom(s). Therapeutic benefits are those associated with the successful treatment of the disease. Finally, palliative benefits are those related to the decrease of the primary symptoms, without necessarily curing the condition (example: decreasing the pain from a flare-up of a chronic condition, such as incurable terminal cancer).  General Risks and Complications: These are associated to most interventional treatments. They can occur alone, or in combination. They fall under one of the following six (6) categories: no benefit or worsening of symptoms; bleeding; infection; nerve damage; allergic reactions; and/or death. 1. No benefits or worsening of symptoms: In Medicine there are no guarantees, only probabilities. No healthcare provider can ever guarantee that a medical treatment will work, they can only state the probability that it may. Furthermore, there is always the possibility that the condition may worsen, either directly, or indirectly, as a consequence of the treatment. 2. Bleeding: This is more common if the patient is taking a blood thinner, either prescription or over the counter (example: Goody Powders, Fish oil, Aspirin, Garlic, etc.), or if suffering a condition associated with impaired coagulation (example: Hemophilia, cirrhosis of the liver, low platelet counts, etc.). However, even if you do not have one on these, it can still happen. If you have any of these conditions, or take one of these drugs, make sure to notify your treating physician. 3. Infection: This is more common in patients with a compromised immune system, either due to disease (example: diabetes, cancer, human immunodeficiency virus [HIV], etc.), or due to medications or treatments (example: therapies used to treat cancer and rheumatological  diseases). However, even if you do not have one on these, it can still happen. If you have any of these conditions, or take one of these drugs, make sure to notify your treating physician. 4. Nerve Damage: This is more common when the treatment is an invasive one, but it can also happen with the use of medications, such as those used in the treatment of cancer. The damage can occur to small secondary nerves, or to large primary ones, such as those in the spinal cord and brain. This damage may be temporary or permanent and it may lead to impairments that can range from temporary numbness to permanent paralysis and/or brain death. 5. Allergic Reactions: Any time a substance or material comes in contact with our body, there is the possibility of an allergic reaction. These can range from a mild skin rash (contact dermatitis) to a severe systemic reaction (anaphylactic reaction), which can result in death. 6. Death: In general, any medical intervention can result in death, most of the time due to an unforeseen complication. ____________________________________________________________________________________________  Facet Blocks Patient Information  Description: The facets are joints in the spine between the vertebrae.  Like any joints in the body, facets can become irritated and painful.  Arthritis can also effect the facets.  By injecting steroids and local anesthetic in and around these joints, we can temporarily block the nerve supply to them.  Steroids act directly on irritated nerves and tissues to reduce selling and inflammation which often leads to decreased pain.  Facet blocks may be done anywhere along the spine from the neck to the low back depending upon the location of your pain.   After numbing the skin with local anesthetic (like Novocaine), a small needle is passed onto the facet joints under x-ray guidance.  You may experience a sensation of pressure while this is being done.  The entire block  usually lasts about 15-25 minutes.   Conditions  which may be treated by facet blocks:   Low back/buttock pain  Neck/shoulder pain  Certain types of headaches  Preparation for the injection:  1. Do not eat any solid food or dairy products within 8 hours of your appointment. 2. You may drink clear liquid up to 3 hours before appointment.  Clear liquids include water, black coffee, juice or soda.  No milk or cream please. 3. You may take your regular medication, including pain medications, with a sip of water before your appointment.  Diabetics should hold regular insulin (if taken separately) and take 1/2 normal NPH dose the morning of the procedure.  Carry some sugar containing items with you to your appointment. 4. A driver must accompany you and be prepared to drive you home after your procedure. 5. Bring all your current medications with you. 6. An IV may be inserted and sedation may be given at the discretion of the physician. 7. A blood pressure cuff, EKG and other monitors will often be applied during the procedure.  Some patients may need to have extra oxygen administered for a short period. 8. You will be asked to provide medical information, including your allergies and medications, prior to the procedure.  We must know immediately if you are taking blood thinners (like Coumadin/Warfarin) or if you are allergic to IV iodine contrast (dye).  We must know if you could possible be pregnant.  Possible side-effects:   Bleeding from needle site  Infection (rare, may require surgery)  Nerve injury (rare)  Numbness & tingling (temporary)  Difficulty urinating (rare, temporary)  Spinal headache (a headache worse with upright posture)  Light-headedness (temporary)  Pain at injection site (serveral days)  Decreased blood pressure (rare, temporary)  Weakness in arm/leg (temporary)  Pressure sensation in back/neck (temporary)   Call if you experience:   Fever/chills  associated with headache or increased back/neck pain  Headache worsened by an upright position  New onset, weakness or numbness of an extremity below the injection site  Hives or difficulty breathing (go to the emergency room)  Inflammation or drainage at the injection site(s)  Severe back/neck pain greater than usual  New symptoms which are concerning to you  Please note:  Although the local anesthetic injected can often make your back or neck feel good for several hours after the injection, the pain will likely return. It takes 3-7 days for steroids to work.  You may not notice any pain relief for at least one week.  If effective, we will often do a series of 2-3 injections spaced 3-6 weeks apart to maximally decrease your pain.  After the initial series, you may be a candidate for a more permanent nerve block of the facets.  If you have any questions, please call [phone] [facility]

## 2018-06-05 ENCOUNTER — Other Ambulatory Visit: Payer: Self-pay | Admitting: Nurse Practitioner

## 2018-06-05 DIAGNOSIS — M7918 Myalgia, other site: Secondary | ICD-10-CM

## 2018-06-25 ENCOUNTER — Ambulatory Visit: Payer: Medicare Other | Admitting: Obstetrics and Gynecology

## 2018-06-26 ENCOUNTER — Ambulatory Visit (INDEPENDENT_AMBULATORY_CARE_PROVIDER_SITE_OTHER): Payer: Medicare Other | Admitting: Obstetrics and Gynecology

## 2018-06-26 ENCOUNTER — Other Ambulatory Visit: Payer: Self-pay

## 2018-06-26 VITALS — BP 124/74 | Wt 126.0 lb

## 2018-06-26 DIAGNOSIS — N3946 Mixed incontinence: Secondary | ICD-10-CM | POA: Diagnosis not present

## 2018-06-26 DIAGNOSIS — N8111 Cystocele, midline: Secondary | ICD-10-CM | POA: Diagnosis not present
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
## 2018-09-28 ENCOUNTER — Ambulatory Visit (INDEPENDENT_AMBULATORY_CARE_PROVIDER_SITE_OTHER): Payer: Medicare Other | Admitting: Obstetrics and Gynecology

## 2018-09-28 ENCOUNTER — Encounter: Payer: Self-pay | Admitting: Obstetrics and Gynecology

## 2018-09-28 ENCOUNTER — Other Ambulatory Visit: Payer: Self-pay

## 2018-09-28 VITALS — BP 120/80 | Ht 62.0 in | Wt 129.0 lb

## 2018-09-28 DIAGNOSIS — N8111 Cystocele, midline: Secondary | ICD-10-CM | POA: Diagnosis not present

## 2018-09-28 DIAGNOSIS — N813 Complete uterovaginal prolapse: Secondary | ICD-10-CM | POA: Diagnosis not present
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
## 2018-11-06 ENCOUNTER — Telehealth: Payer: Self-pay | Admitting: Obstetrics and Gynecology

## 2018-11-06 NOTE — Telephone Encounter (Signed)
-----   Message from [place], LPN sent at 2/48/2500 10:11 AM EDT ----- Regarding: RE: Order pessary Pessary arrived. Please contact pt to schedule apt for insertion. Thanks! ----- Message ----- From: Will Bonnet, MD Sent: 09/28/2018   5:20 PM EDT To: Otila Kluver, LPN Subject: Order pessary                                  Please order the patient a #7 Gellhorn pessary and call her and have an appointment scheduled for placement with me. Thank you! Prentice Docker, MD

## 2018-11-06 NOTE — Telephone Encounter (Signed)
Called and left voice mail for patient to call back to be schedule °

## 2018-11-19 ENCOUNTER — Encounter: Payer: Self-pay | Admitting: Pain Medicine

## 2018-11-20 ENCOUNTER — Ambulatory Visit: Payer: Medicare Other | Admitting: Obstetrics and Gynecology
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
## 2018-11-23 ENCOUNTER — Ambulatory Visit: Payer: Medicare Other | Attending: Nurse Practitioner | Admitting: Pain Medicine

## 2018-11-23 ENCOUNTER — Other Ambulatory Visit: Payer: Self-pay

## 2018-11-23 DIAGNOSIS — Z79899 Other long term (current) drug therapy: Secondary | ICD-10-CM

## 2018-11-23 DIAGNOSIS — M545 Low back pain: Secondary | ICD-10-CM

## 2018-11-23 DIAGNOSIS — Z789 Other specified health status: Secondary | ICD-10-CM

## 2018-11-23 DIAGNOSIS — G894 Chronic pain syndrome: Secondary | ICD-10-CM

## 2018-11-23 DIAGNOSIS — G8929 Other chronic pain: Secondary | ICD-10-CM

## 2018-11-23 DIAGNOSIS — M7918 Myalgia, other site: Secondary | ICD-10-CM

## 2018-11-23 DIAGNOSIS — M899 Disorder of bone, unspecified: Secondary | ICD-10-CM | POA: Insufficient documentation

## 2018-11-23 MED ORDER — TRAMADOL HCL 50 MG PO TABS: 100.0000 mg | ORAL_TABLET | Freq: Three times a day (TID) | ORAL | 5 refills | Status: DC

## 2018-11-23 MED ORDER — CYCLOBENZAPRINE HCL 10 MG PO TABS: 10.0000 mg | ORAL_TABLET | Freq: Three times a day (TID) | ORAL | 5 refills | Status: DC

## 2018-11-25 ENCOUNTER — Encounter: Payer: Medicare Other | Admitting: Nurse Practitioner

## 2018-11-27 ENCOUNTER — Encounter: Payer: Self-pay | Admitting: Obstetrics and Gynecology

## 2018-11-27 ENCOUNTER — Other Ambulatory Visit: Payer: Self-pay

## 2018-11-27 ENCOUNTER — Ambulatory Visit (INDEPENDENT_AMBULATORY_CARE_PROVIDER_SITE_OTHER): Payer: Medicare Other | Admitting: Obstetrics and Gynecology

## 2018-11-27 VITALS — BP 114/70 | Ht 65.0 in

## 2018-11-27 DIAGNOSIS — N819 Female genital prolapse, unspecified: Secondary | ICD-10-CM | POA: Diagnosis not present

## 2018-11-27 DIAGNOSIS — N8111 Cystocele, midline: Secondary | ICD-10-CM

## 2018-11-27 NOTE — Progress Notes (Signed)
     Pessary Fitting Patient presents for a pessary fitting. She desires a pessary as her means of controlling her symptoms of prolapse and/or urinary incontinence. She understands the care needed for a pessary and desires to proceed. Alternative treatment options have been discussed at length and the patient voices an understanding of each option.   PROCEDURE: The patient was placed in dorsal lithotomy position. Examination confirmed prolapse. A 7 Gellhorn pessary was fitted without difficulty. The patient subsequently ambulated, voided and performed valsalva maneuvers without dislodging the pessary and without discomfort. Care instructions were provided. Patient was discharged to home in stable condition.    Return in about 4 weeks (around 12/25/2018) for Pessary Check.  Prentice Docker, MD, [facility], [facility] 11/27/2018 2:28 PM

## 2018-12-25 ENCOUNTER — Other Ambulatory Visit: Payer: Self-pay

## 2018-12-25 ENCOUNTER — Encounter: Payer: Self-pay | Admitting: Obstetrics and Gynecology

## 2018-12-25 ENCOUNTER — Ambulatory Visit (INDEPENDENT_AMBULATORY_CARE_PROVIDER_SITE_OTHER): Payer: Medicare Other | Admitting: Obstetrics and Gynecology

## 2018-12-25 VITALS — BP 118/74 | Ht 62.0 in

## 2018-12-25 DIAGNOSIS — N813 Complete uterovaginal prolapse: Secondary | ICD-10-CM | POA: Diagnosis not present
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
## 2019-03-26 ENCOUNTER — Ambulatory Visit: Payer: Medicare Other | Admitting: Obstetrics and Gynecology

## 2019-03-29 ENCOUNTER — Ambulatory Visit: Payer: Medicare Other | Admitting: Obstetrics and Gynecology

## 2019-05-20 ENCOUNTER — Telehealth: Payer: Self-pay | Admitting: *Deleted

## 2019-05-20 NOTE — Telephone Encounter (Signed)
Attempted to call for pre appointment review of allergies/meds. Message left. 

## 2019-05-21 ENCOUNTER — Encounter: Payer: Self-pay | Admitting: Pain Medicine

## 2019-05-23 NOTE — Progress Notes (Signed)
Unsuccessful attempt to contact patient for Virtual Visit (Pain Management Telehealth)   Patient provided contact information:  [phone] (home); There is no such number on file (mobile).; (Preferred) [phone] No e-mail address on record   Pre-screening:  Our staff was successful in contacting Ellen Fleming using the above provided information.   I unsuccessfully attempted to make contact with Ellen Fleming on 05/24/2019 via telephone. I was unable to complete the virtual encounter due to call going directly to voicemail. I was able to leave a message where I clearly identify myself as Oswaldo Done, MD and I left a message to call us back to reschedule the call.  Pharmacotherapy Assessment  Analgesic: Tramadol 50 mg, 2 tabs PO q 8 hrs (PRN) (300 mg/day of tramadol) MME/day:30 mg/day.   Follow-up plan:   Reschedule Visit.     Interventional options:  Considering:  Diagnostic bilateral lumbar facet block Possible lumbar facet radiofrequency ablation. Diagnostic bilateral sacroiliac joint block Diagnostic left cervical epidural steroid injection Diagnostic bilateral cervical facet block Possible cervical facet radiofrequencyablation Diagnostic right sided L2-3 lumbar epidural steroid injection Diagnostic intra-articular right hip injection   Palliative PRN treatment(s):  Diagnostic bilateral lumbar facet block Diagnostic bilateral sacroiliac joint block Diagnostic left cervical epidural steroid injection Diagnostic bilateral cervical facet block Diagnostic intra-articular right hip injection Diagnostic right sided L2-3 lumbar epidural steroid injection     Recent Visits No visits were found meeting these conditions.  Showing recent visits within past 90 days and meeting all other requirements   Future Appointments Date Type Provider Dept  05/25/19 Appointment Delano Metz, MD [facility]  Showing future appointments within next 90 days and  meeting all other requirements    Note by: Oswaldo Done, MD Date: 05/24/2019; Time: 1:24 PM

## 2019-05-24 ENCOUNTER — Other Ambulatory Visit: Payer: Self-pay

## 2019-05-24 ENCOUNTER — Encounter: Payer: Self-pay | Admitting: Pain Medicine

## 2019-05-24 ENCOUNTER — Ambulatory Visit (HOSPITAL_BASED_OUTPATIENT_CLINIC_OR_DEPARTMENT_OTHER): Payer: Medicare Other | Admitting: Pain Medicine

## 2019-05-24 DIAGNOSIS — G894 Chronic pain syndrome: Secondary | ICD-10-CM

## 2019-05-25 ENCOUNTER — Ambulatory Visit: Payer: Medicare Other | Attending: Pain Medicine | Admitting: Pain Medicine

## 2019-05-25 ENCOUNTER — Other Ambulatory Visit: Payer: Self-pay

## 2019-05-25 ENCOUNTER — Telehealth: Payer: Self-pay | Admitting: *Deleted

## 2019-05-25 DIAGNOSIS — M899 Disorder of bone, unspecified: Secondary | ICD-10-CM

## 2019-05-25 DIAGNOSIS — Z789 Other specified health status: Secondary | ICD-10-CM

## 2019-05-25 DIAGNOSIS — Z79899 Other long term (current) drug therapy: Secondary | ICD-10-CM

## 2019-05-25 DIAGNOSIS — G894 Chronic pain syndrome: Secondary | ICD-10-CM

## 2019-05-25 NOTE — Progress Notes (Signed)
Unsuccessful attempt to contact patient for Virtual Visit (Pain Management Telehealth)   Patient provided contact information:  [phone] (home); There is no such number on file (mobile).; (Preferred) [phone] No e-mail address on record   Pre-screening:  Our staff was successful in contacting Ellen Fleming using the above provided information.   I unsuccessfully attempted to make contact with Ellen Fleming on 05/25/2019 via telephone. I was unable to complete the virtual encounter due to call going directly to voicemail. I was able to leave a message where I clearly identify myself as Oswaldo Done, MD and I left a message to call us back to reschedule the call.  Pharmacotherapy Assessment  Analgesic: Tramadol 50 mg, 2 tabs PO q 8 hrs (PRN) (300 mg/day of tramadol) MME/day:30 mg/day.   Follow-up plan:   Reschedule Visit.     Interventional options:  Considering:  Diagnostic bilateral lumbar facet block Possible lumbar facet radiofrequency ablation. Diagnostic bilateral sacroiliac joint block Diagnostic left cervical epidural steroid injection Diagnostic bilateral cervical facet block Possible cervical facet radiofrequencyablation Diagnostic right sided L2-3 lumbar epidural steroid injection Diagnostic intra-articular right hip injection   Palliative PRN treatment(s):  Diagnostic bilateral lumbar facet block Diagnostic bilateral sacroiliac joint block Diagnostic left cervical epidural steroid injection Diagnostic bilateral cervical facet block Diagnostic intra-articular right hip injection Diagnostic right sided L2-3 lumbar epidural steroid injection    Recent Visits No visits were found meeting these conditions.  Showing recent visits within past 90 days and meeting all other requirements   Today's Visits Date Type Provider Dept  05/25/19 Office Visit Delano Metz, MD [facility]  Showing today's visits and meeting all other requirements    Future Appointments No visits were found meeting these conditions.  Showing future appointments within next 90 days and meeting all other requirements    Note by: Oswaldo Done, MD Date: 05/25/2019; Time: 12:11 PM

## 2019-05-25 NOTE — Progress Notes (Signed)
Patient called, I gave her both your numbers to place in contact list She called from [phone]

## 2019-06-02 ENCOUNTER — Other Ambulatory Visit
Admission: RE | Admit: 2019-06-02 | Discharge: 2019-06-02 | Disposition: A | Discharge status: Home / Self Care | Payer: Medicare Other | Source: Ambulatory Visit | Attending: Pain Medicine | Admitting: Pain Medicine

## 2019-06-02 DIAGNOSIS — M899 Disorder of bone, unspecified: Secondary | ICD-10-CM | POA: Insufficient documentation

## 2019-06-02 DIAGNOSIS — Z79899 Other long term (current) drug therapy: Secondary | ICD-10-CM | POA: Diagnosis not present

## 2019-06-02 DIAGNOSIS — Z789 Other specified health status: Secondary | ICD-10-CM | POA: Diagnosis not present

## 2019-06-02 LAB — MAGNESIUM: Magnesium: 2 mg/dL (ref 1.7–2.4)

## 2019-06-02 LAB — VITAMIN B12: Vitamin B-12: 225 pg/mL (ref 180–914)

## 2019-06-02 LAB — SEDIMENTATION RATE: Sed Rate: 39 mm/hr — ABNORMAL HIGH (ref 0–30)

## 2019-06-02 LAB — C-REACTIVE PROTEIN: CRP: 1 mg/dL — ABNORMAL HIGH (ref <1.0)

## 2019-06-02 LAB — VITAMIN D 25 HYDROXY (VIT D DEFICIENCY, FRACTURES): Vit D, 25-Hydroxy: 13.36 ng/mL — ABNORMAL LOW (ref 30–100)

## 2019-06-03 ENCOUNTER — Telehealth: Payer: Self-pay | Admitting: Pain Medicine

## 2019-06-03 NOTE — Telephone Encounter (Signed)
Patient advised that no meds are prescribed outside of appt. She completed her labs yesterday.

## 2019-06-03 NOTE — Telephone Encounter (Signed)
Patient lvmail asking to get muscle relaxer called in. She needs to come for labs and UDS, she has appt schedule for 06-09-19 w/ Dr. Essie Hart. Please let patient know her options

## 2019-06-05 LAB — MISC LABCORP TEST (SEND OUT): Labcorp test code: 738526

## 2019-06-08 ENCOUNTER — Encounter: Payer: Self-pay | Admitting: Pain Medicine

## 2019-06-08 DIAGNOSIS — E559 Vitamin D deficiency, unspecified: Secondary | ICD-10-CM | POA: Insufficient documentation

## 2019-06-08 MED ORDER — CYCLOBENZAPRINE HCL 10 MG PO TABS: 10.0000 mg | ORAL_TABLET | Freq: Three times a day (TID) | ORAL | 5 refills | Status: DC

## 2019-06-08 MED ORDER — MAGNESIUM 500 MG PO CAPS: 500.0000 mg | ORAL_CAPSULE | Freq: Every day | ORAL | 11 refills | Status: DC

## 2019-06-08 MED ORDER — VITAMIN D3 125 MCG (5000 UT) PO CAPS: 1.0000 | ORAL_CAPSULE | Freq: Every day | ORAL | 11 refills | Status: DC

## 2019-06-08 MED ORDER — CALCIUM CARBONATE 600 MG PO TABS: 1200.0000 mg | ORAL_TABLET | Freq: Every day | ORAL | 11 refills | Status: DC

## 2019-06-08 MED ORDER — ERGOCALCIFEROL 1.25 MG (50000 UT) PO CAPS: 50000.0000 [IU] | ORAL_CAPSULE | ORAL | 1 refills | Status: DC

## 2019-06-08 MED ORDER — TRAMADOL HCL 50 MG PO TABS: 100.0000 mg | ORAL_TABLET | Freq: Three times a day (TID) | ORAL | 5 refills | Status: DC
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
## 2019-06-09 ENCOUNTER — Other Ambulatory Visit: Payer: Self-pay

## 2019-06-09 ENCOUNTER — Ambulatory Visit: Payer: Medicare Other | Attending: Pain Medicine | Admitting: Pain Medicine

## 2019-06-09 DIAGNOSIS — G894 Chronic pain syndrome: Secondary | ICD-10-CM | POA: Diagnosis not present

## 2019-06-09 DIAGNOSIS — G8929 Other chronic pain: Secondary | ICD-10-CM

## 2019-06-09 DIAGNOSIS — M79604 Pain in right leg: Secondary | ICD-10-CM | POA: Diagnosis not present

## 2019-06-09 DIAGNOSIS — M7918 Myalgia, other site: Secondary | ICD-10-CM

## 2019-06-09 DIAGNOSIS — M810 Age-related osteoporosis without current pathological fracture: Secondary | ICD-10-CM

## 2019-06-09 DIAGNOSIS — E559 Vitamin D deficiency, unspecified: Secondary | ICD-10-CM

## 2019-06-09 DIAGNOSIS — M545 Low back pain: Secondary | ICD-10-CM

## 2019-06-29 ENCOUNTER — Encounter: Payer: Self-pay | Admitting: *Deleted

## 2019-06-29 ENCOUNTER — Inpatient Hospital Stay
Admission: EM | Admit: 2019-06-29 | Discharge: 2019-07-04 | DRG: 871 | Disposition: A | Discharge status: Home Health | Payer: Medicare Other | Attending: Internal Medicine | Admitting: Internal Medicine

## 2019-06-29 ENCOUNTER — Ambulatory Visit (INDEPENDENT_AMBULATORY_CARE_PROVIDER_SITE_OTHER): Payer: Medicare Other

## 2019-06-29 ENCOUNTER — Ambulatory Visit (INDEPENDENT_AMBULATORY_CARE_PROVIDER_SITE_OTHER)
Admission: EM | Admit: 2019-06-29 | Discharge: 2019-06-29 | Disposition: A | Discharge status: Other Acute Inpatient Hospital | Payer: Medicare Other | Source: Home / Self Care

## 2019-06-29 ENCOUNTER — Other Ambulatory Visit: Payer: Self-pay

## 2019-06-29 DIAGNOSIS — A419 Sepsis, unspecified organism: Secondary | ICD-10-CM | POA: Insufficient documentation

## 2019-06-29 DIAGNOSIS — E872 Acidosis: Secondary | ICD-10-CM | POA: Diagnosis present

## 2019-06-29 DIAGNOSIS — Z882 Allergy status to sulfonamides status: Secondary | ICD-10-CM

## 2019-06-29 DIAGNOSIS — Z03818 Encounter for observation for suspected exposure to other biological agents ruled out: Secondary | ICD-10-CM

## 2019-06-29 DIAGNOSIS — K5900 Constipation, unspecified: Secondary | ICD-10-CM | POA: Diagnosis present

## 2019-06-29 DIAGNOSIS — A403 Sepsis due to Streptococcus pneumoniae: Secondary | ICD-10-CM | POA: Diagnosis not present

## 2019-06-29 DIAGNOSIS — E860 Dehydration: Secondary | ICD-10-CM

## 2019-06-29 DIAGNOSIS — D539 Nutritional anemia, unspecified: Secondary | ICD-10-CM | POA: Diagnosis present

## 2019-06-29 DIAGNOSIS — Z20822 Contact with and (suspected) exposure to covid-19: Secondary | ICD-10-CM

## 2019-06-29 DIAGNOSIS — F4310 Post-traumatic stress disorder, unspecified: Secondary | ICD-10-CM | POA: Diagnosis present

## 2019-06-29 DIAGNOSIS — I10 Essential (primary) hypertension: Secondary | ICD-10-CM | POA: Diagnosis present

## 2019-06-29 DIAGNOSIS — J189 Pneumonia, unspecified organism: Secondary | ICD-10-CM

## 2019-06-29 DIAGNOSIS — Z79899 Other long term (current) drug therapy: Secondary | ICD-10-CM

## 2019-06-29 DIAGNOSIS — R531 Weakness: Secondary | ICD-10-CM | POA: Diagnosis not present

## 2019-06-29 DIAGNOSIS — R05 Cough: Secondary | ICD-10-CM

## 2019-06-29 DIAGNOSIS — R079 Chest pain, unspecified: Secondary | ICD-10-CM

## 2019-06-29 DIAGNOSIS — F17210 Nicotine dependence, cigarettes, uncomplicated: Secondary | ICD-10-CM | POA: Diagnosis present

## 2019-06-29 DIAGNOSIS — N179 Acute kidney failure, unspecified: Secondary | ICD-10-CM | POA: Diagnosis present

## 2019-06-29 DIAGNOSIS — G894 Chronic pain syndrome: Secondary | ICD-10-CM | POA: Diagnosis present

## 2019-06-29 DIAGNOSIS — M069 Rheumatoid arthritis, unspecified: Secondary | ICD-10-CM | POA: Diagnosis present

## 2019-06-29 DIAGNOSIS — E871 Hypo-osmolality and hyponatremia: Secondary | ICD-10-CM | POA: Diagnosis present

## 2019-06-29 DIAGNOSIS — J13 Pneumonia due to Streptococcus pneumoniae: Secondary | ICD-10-CM | POA: Diagnosis present

## 2019-06-29 DIAGNOSIS — F329 Major depressive disorder, single episode, unspecified: Secondary | ICD-10-CM | POA: Diagnosis present

## 2019-06-29 DIAGNOSIS — E876 Hypokalemia: Secondary | ICD-10-CM | POA: Diagnosis present

## 2019-06-29 LAB — BASIC METABOLIC PANEL
Anion gap: 14 (ref 5–15)
BUN: 25 mg/dL — ABNORMAL HIGH (ref 8–23)
CO2: 17 mmol/L — ABNORMAL LOW (ref 22–32)
Calcium: 8.5 mg/dL — ABNORMAL LOW (ref 8.9–10.3)
Chloride: 95 mmol/L — ABNORMAL LOW (ref 98–111)
Creatinine, Ser: 1.24 mg/dL — ABNORMAL HIGH (ref 0.44–1.00)
GFR calc Af Amer: 52 mL/min — ABNORMAL LOW (ref >60)
GFR calc non Af Amer: 45 mL/min — ABNORMAL LOW (ref >60)
Glucose, Bld: 127 mg/dL — ABNORMAL HIGH (ref 70–99)
Potassium: 3.8 mmol/L (ref 3.5–5.1)
Sodium: 126 mmol/L — ABNORMAL LOW (ref 135–145)

## 2019-06-29 LAB — CBC WITH DIFFERENTIAL/PLATELET
Abs Immature Granulocytes: 0.19 10*3/uL — ABNORMAL HIGH (ref 0.00–0.07)
Basophils Absolute: 0.1 10*3/uL (ref 0.0–0.1)
Basophils Relative: 0 %
Eosinophils Absolute: 0.2 10*3/uL (ref 0.0–0.5)
Eosinophils Relative: 1 %
HCT: 31.5 % — ABNORMAL LOW (ref 36.0–46.0)
Hemoglobin: 11.1 g/dL — ABNORMAL LOW (ref 12.0–15.0)
Immature Granulocytes: 1 %
Lymphocytes Relative: 5 %
Lymphs Abs: 1.4 10*3/uL (ref 0.7–4.0)
MCH: 30.7 pg (ref 26.0–34.0)
MCHC: 35.2 g/dL (ref 30.0–36.0)
MCV: 87.3 fL (ref 80.0–100.0)
Monocytes Absolute: 2.2 10*3/uL — ABNORMAL HIGH (ref 0.1–1.0)
Monocytes Relative: 8 %
Neutro Abs: 25 10*3/uL — ABNORMAL HIGH (ref 1.7–7.7)
Neutrophils Relative %: 85 %
Platelets: 296 10*3/uL (ref 150–400)
RBC: 3.61 MIL/uL — ABNORMAL LOW (ref 3.87–5.11)
RDW: 13.2 % (ref 11.5–15.5)
WBC: 29.1 10*3/uL — ABNORMAL HIGH (ref 4.0–10.5)
nRBC: 0 % (ref 0.0–0.2)

## 2019-06-29 LAB — SARS CORONAVIRUS 2 AG (30 MIN TAT): SARS Coronavirus 2 Ag: NEGATIVE

## 2019-06-29 LAB — LACTIC ACID, PLASMA: Lactic Acid, Venous: 1.5 mmol/L (ref 0.5–1.9)

## 2019-06-29 MED ORDER — SODIUM CHLORIDE 0.9 % IV SOLN
500.0000 mg | INTRAVENOUS | Status: DC
  Administered 2019-06-30: 500 mg via INTRAVENOUS
  Filled 2019-06-29: qty 500

## 2019-06-29 MED ORDER — SODIUM CHLORIDE 0.9 % IV BOLUS
1000.0000 mL | Freq: Once | INTRAVENOUS | Status: AC
  Administered 2019-06-29: 1000 mL via INTRAVENOUS

## 2019-06-29 MED ORDER — SODIUM CHLORIDE 0.9 % IV BOLUS: 1000.0000 mL | Freq: Once | INTRAVENOUS | Status: DC

## 2019-06-29 MED ORDER — ACETAMINOPHEN 500 MG PO TABS: 1000.0000 mg | ORAL_TABLET | Freq: Once | ORAL | Status: DC

## 2019-06-29 MED ORDER — SODIUM CHLORIDE 0.9 % IV SOLN
2.0000 g | INTRAVENOUS | Status: DC
  Administered 2019-06-29: 2 g via INTRAVENOUS
  Filled 2019-06-29: qty 20

## 2019-06-29 MED ORDER — IBUPROFEN 400 MG PO TABS
400.0000 mg | ORAL_TABLET | Freq: Once | ORAL | Status: AC
  Administered 2019-06-29: 400 mg via ORAL

## 2019-06-29 NOTE — ED Triage Notes (Signed)
Pt presents with c/o fatigue, BLE pain, back pain, cough, shob and fever (102 today) that started about a week ago. Pt denies any known sick contacts.

## 2019-06-29 NOTE — Discharge Instructions (Addendum)
It was very nice seeing you today in clinic. Thank you for entrusting me with your care.   Your condition requires further care and probable admission to the hospital. I have spoken with the ER and they are expecting you. Please head to the ER directly from urgent care.   Again, it was my pleasure to take care of you today. Thank you for choosing our clinic. I hope that you start to feel better quickly.   Quentin Mulling, MSN, APRN, FNP-C, CEN Advanced Practice Provider [facility]
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
## 2019-06-29 NOTE — ED Triage Notes (Addendum)
Pt to ED from [facility] after evaluation for generalized body aches and fatigue with a cough and SOB. Bloodwork confirms elevated WBC and CXR confirms pneumonia. Pt sent to ED for further evaluation of pneumonia.   Pt reports a temp of 100.5 at [place] [facility] but was given Ibuprofen 2 hours ago.
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
## 2019-06-29 NOTE — ED Notes (Signed)
First set of Blood Cultures sent with blood work.

## 2019-06-29 NOTE — ED Notes (Signed)
1st set of blood cultures collected in Triage. 2nd set of blood cultures collected by Butch, RN from left [facility].

## 2019-06-30 DIAGNOSIS — E872 Acidosis: Secondary | ICD-10-CM | POA: Diagnosis present

## 2019-06-30 DIAGNOSIS — A419 Sepsis, unspecified organism: Secondary | ICD-10-CM | POA: Diagnosis present

## 2019-06-30 DIAGNOSIS — K5900 Constipation, unspecified: Secondary | ICD-10-CM | POA: Diagnosis present

## 2019-06-30 DIAGNOSIS — Z79899 Other long term (current) drug therapy: Secondary | ICD-10-CM | POA: Diagnosis not present

## 2019-06-30 DIAGNOSIS — G894 Chronic pain syndrome: Secondary | ICD-10-CM | POA: Diagnosis present

## 2019-06-30 DIAGNOSIS — N179 Acute kidney failure, unspecified: Secondary | ICD-10-CM | POA: Diagnosis present

## 2019-06-30 DIAGNOSIS — F329 Major depressive disorder, single episode, unspecified: Secondary | ICD-10-CM | POA: Diagnosis present

## 2019-06-30 DIAGNOSIS — Z20822 Contact with and (suspected) exposure to covid-19: Secondary | ICD-10-CM | POA: Diagnosis present

## 2019-06-30 DIAGNOSIS — J13 Pneumonia due to Streptococcus pneumoniae: Secondary | ICD-10-CM | POA: Diagnosis present

## 2019-06-30 DIAGNOSIS — E876 Hypokalemia: Secondary | ICD-10-CM | POA: Diagnosis present

## 2019-06-30 DIAGNOSIS — F17210 Nicotine dependence, cigarettes, uncomplicated: Secondary | ICD-10-CM | POA: Diagnosis present

## 2019-06-30 DIAGNOSIS — A409 Streptococcal sepsis, unspecified: Secondary | ICD-10-CM | POA: Diagnosis not present

## 2019-06-30 DIAGNOSIS — M069 Rheumatoid arthritis, unspecified: Secondary | ICD-10-CM | POA: Diagnosis present

## 2019-06-30 DIAGNOSIS — E871 Hypo-osmolality and hyponatremia: Secondary | ICD-10-CM | POA: Diagnosis present

## 2019-06-30 DIAGNOSIS — F4310 Post-traumatic stress disorder, unspecified: Secondary | ICD-10-CM | POA: Diagnosis present

## 2019-06-30 DIAGNOSIS — I10 Essential (primary) hypertension: Secondary | ICD-10-CM | POA: Diagnosis present

## 2019-06-30 DIAGNOSIS — D539 Nutritional anemia, unspecified: Secondary | ICD-10-CM | POA: Diagnosis present

## 2019-06-30 DIAGNOSIS — J189 Pneumonia, unspecified organism: Secondary | ICD-10-CM | POA: Diagnosis not present

## 2019-06-30 DIAGNOSIS — Z882 Allergy status to sulfonamides status: Secondary | ICD-10-CM | POA: Diagnosis not present

## 2019-06-30 DIAGNOSIS — A403 Sepsis due to Streptococcus pneumoniae: Secondary | ICD-10-CM | POA: Diagnosis present

## 2019-06-30 LAB — GLUCOSE, CAPILLARY: Glucose-Capillary: 119 mg/dL — ABNORMAL HIGH (ref 70–99)

## 2019-06-30 LAB — URINALYSIS, ROUTINE W REFLEX MICROSCOPIC
Bilirubin Urine: NEGATIVE
Glucose, UA: NEGATIVE mg/dL
Hgb urine dipstick: NEGATIVE
Ketones, ur: 5 mg/dL — AB
Nitrite: NEGATIVE
Protein, ur: NEGATIVE mg/dL
Specific Gravity, Urine: 1.015 (ref 1.005–1.030)
pH: 6 (ref 5.0–8.0)

## 2019-06-30 LAB — BRAIN NATRIURETIC PEPTIDE: B Natriuretic Peptide: 241 pg/mL — ABNORMAL HIGH (ref 0.0–100.0)

## 2019-06-30 LAB — TSH: TSH: 1.658 u[IU]/mL (ref 0.350–4.500)

## 2019-06-30 LAB — PHOSPHORUS: Phosphorus: 2.7 mg/dL (ref 2.5–4.6)

## 2019-06-30 LAB — RESPIRATORY PANEL BY RT PCR (FLU A&B, COVID)
Influenza A by PCR: NEGATIVE
Influenza B by PCR: NEGATIVE
SARS Coronavirus 2 by RT PCR: NEGATIVE

## 2019-06-30 LAB — CREATININE, SERUM
Creatinine, Ser: 1.05 mg/dL — ABNORMAL HIGH (ref 0.44–1.00)
GFR calc Af Amer: >60 mL/min (ref >60)
GFR calc non Af Amer: 55 mL/min — ABNORMAL LOW (ref >60)

## 2019-06-30 LAB — HIV ANTIBODY (ROUTINE TESTING W REFLEX): HIV Screen 4th Generation wRfx: NONREACTIVE

## 2019-06-30 LAB — APTT: aPTT: 33 seconds (ref 24–36)

## 2019-06-30 LAB — MAGNESIUM: Magnesium: 2 mg/dL (ref 1.7–2.4)

## 2019-06-30 LAB — CALCIUM: Calcium: 7.5 mg/dL — ABNORMAL LOW (ref 8.9–10.3)

## 2019-06-30 MED ORDER — SENNOSIDES-DOCUSATE SODIUM 8.6-50 MG PO TABS: 1.0000 | ORAL_TABLET | Freq: Every evening | ORAL | Status: DC | PRN

## 2019-06-30 MED ORDER — VITAMIN D 25 MCG (1000 UNIT) PO TABS
5000.0000 [IU] | ORAL_TABLET | Freq: Every day | ORAL | Status: DC
  Administered 2019-07-01 – 2019-07-04 (×4): 5000 [IU] via ORAL
  Filled 2019-06-30 (×4): qty 5

## 2019-06-30 MED ORDER — ALPRAZOLAM 0.5 MG PO TABS: 0.5000 mg | ORAL_TABLET | Freq: Two times a day (BID) | ORAL | Status: DC | PRN

## 2019-06-30 MED ORDER — VANCOMYCIN HCL 750 MG/150ML IV SOLN
750.0000 mg | INTRAVENOUS | Status: DC
  Administered 2019-07-01: 750 mg via INTRAVENOUS
  Filled 2019-06-30: qty 150

## 2019-06-30 MED ORDER — LACTATED RINGERS IV BOLUS (SEPSIS)
1000.0000 mL | Freq: Once | INTRAVENOUS | Status: AC
  Administered 2019-06-30: 1000 mL via INTRAVENOUS

## 2019-06-30 MED ORDER — MAGNESIUM OXIDE 400 (241.3 MG) MG PO TABS
400.0000 mg | ORAL_TABLET | Freq: Every day | ORAL | Status: DC
  Administered 2019-07-01 – 2019-07-04 (×4): 400 mg via ORAL
  Filled 2019-06-30 (×4): qty 1

## 2019-06-30 MED ORDER — SODIUM CHLORIDE 0.9 % IV SOLN
2.0000 g | Freq: Once | INTRAVENOUS | Status: AC
  Administered 2019-06-30: 2 g via INTRAVENOUS
  Filled 2019-06-30: qty 2

## 2019-06-30 MED ORDER — HYDROCODONE-ACETAMINOPHEN 5-325 MG PO TABS
1.0000 | ORAL_TABLET | ORAL | Status: DC | PRN
  Administered 2019-07-02 – 2019-07-04 (×2): 2 via ORAL
  Filled 2019-06-30 (×2): qty 2

## 2019-06-30 MED ORDER — HEPARIN SODIUM (PORCINE) 5000 UNIT/ML IJ SOLN
5000.0000 [IU] | Freq: Three times a day (TID) | INTRAMUSCULAR | Status: DC
  Administered 2019-06-30 – 2019-07-04 (×12): 5000 [IU] via SUBCUTANEOUS
  Filled 2019-06-30 (×11): qty 1

## 2019-06-30 MED ORDER — ALBUTEROL SULFATE (2.5 MG/3ML) 0.083% IN NEBU: 2.5000 mg | INHALATION_SOLUTION | RESPIRATORY_TRACT | Status: DC | PRN

## 2019-06-30 MED ORDER — SODIUM CHLORIDE 0.9 % IV SOLN: INTRAVENOUS | Status: DC

## 2019-06-30 MED ORDER — DEXTROSE-NACL 5-0.45 % IV SOLN: INTRAVENOUS | Status: DC

## 2019-06-30 MED ORDER — MAGNESIUM CITRATE PO SOLN
1.0000 | Freq: Once | ORAL | Status: DC | PRN
  Filled 2019-06-30: qty 296

## 2019-06-30 MED ORDER — TRAMADOL HCL 50 MG PO TABS
100.0000 mg | ORAL_TABLET | Freq: Three times a day (TID) | ORAL | Status: DC
  Administered 2019-06-30 – 2019-07-03 (×9): 100 mg via ORAL
  Filled 2019-06-30 (×9): qty 2

## 2019-06-30 MED ORDER — DOCUSATE SODIUM 100 MG PO CAPS
100.0000 mg | ORAL_CAPSULE | Freq: Two times a day (BID) | ORAL | Status: DC
  Administered 2019-06-30 – 2019-07-04 (×5): 100 mg via ORAL
  Filled 2019-06-30 (×6): qty 1

## 2019-06-30 MED ORDER — VANCOMYCIN HCL IN DEXTROSE 1-5 GM/200ML-% IV SOLN
1000.0000 mg | Freq: Once | INTRAVENOUS | Status: AC
  Administered 2019-06-30: 1000 mg via INTRAVENOUS
  Filled 2019-06-30: qty 200

## 2019-06-30 MED ORDER — ONDANSETRON HCL 4 MG PO TABS: 4.0000 mg | ORAL_TABLET | Freq: Four times a day (QID) | ORAL | Status: DC | PRN

## 2019-06-30 MED ORDER — SERTRALINE HCL 50 MG PO TABS
25.0000 mg | ORAL_TABLET | Freq: Every day | ORAL | Status: DC
  Administered 2019-07-01 – 2019-07-04 (×4): 25 mg via ORAL
  Filled 2019-06-30 (×4): qty 1

## 2019-06-30 MED ORDER — SODIUM CHLORIDE 0.9 % IV SOLN
2.0000 g | Freq: Two times a day (BID) | INTRAVENOUS | Status: DC
  Administered 2019-06-30: 2 g via INTRAVENOUS
  Filled 2019-06-30 (×4): qty 2

## 2019-06-30 MED ORDER — SODIUM CHLORIDE 0.9% FLUSH
3.0000 mL | Freq: Two times a day (BID) | INTRAVENOUS | Status: DC
  Administered 2019-06-30 – 2019-07-04 (×8): 3 mL via INTRAVENOUS

## 2019-06-30 MED ORDER — ACETAMINOPHEN 650 MG RE SUPP: 650.0000 mg | Freq: Four times a day (QID) | RECTAL | Status: DC | PRN

## 2019-06-30 MED ORDER — LEVALBUTEROL HCL 0.63 MG/3ML IN NEBU: 0.6300 mg | INHALATION_SOLUTION | Freq: Four times a day (QID) | RESPIRATORY_TRACT | Status: DC | PRN

## 2019-06-30 MED ORDER — HYDROCOD POLST-CPM POLST ER 10-8 MG/5ML PO SUER
5.0000 mL | Freq: Once | ORAL | Status: AC
  Administered 2019-06-30: 5 mL via ORAL
  Filled 2019-06-30: qty 5

## 2019-06-30 MED ORDER — ONDANSETRON HCL 4 MG/2ML IJ SOLN: 4.0000 mg | Freq: Four times a day (QID) | INTRAMUSCULAR | Status: DC | PRN

## 2019-06-30 MED ORDER — VITAMIN D (ERGOCALCIFEROL) 1.25 MG (50000 UNIT) PO CAPS
50000.0000 [IU] | ORAL_CAPSULE | ORAL | Status: DC
  Administered 2019-07-01: 50000 [IU] via ORAL
  Filled 2019-06-30: qty 1

## 2019-06-30 MED ORDER — SORBITOL 70 % SOLN
30.0000 mL | Freq: Every day | Status: DC | PRN
  Filled 2019-06-30: qty 30

## 2019-06-30 MED ORDER — TRAZODONE HCL 50 MG PO TABS: 50.0000 mg | ORAL_TABLET | Freq: Every evening | ORAL | Status: DC | PRN

## 2019-06-30 MED ORDER — CALCIUM CARBONATE ANTACID 500 MG PO CHEW
1000.0000 mg | CHEWABLE_TABLET | Freq: Every day | ORAL | Status: DC
  Administered 2019-07-01 – 2019-07-02 (×2): 1000 mg via ORAL
  Filled 2019-06-30 (×3): qty 5

## 2019-06-30 MED ORDER — KETOROLAC TROMETHAMINE 15 MG/ML IJ SOLN
15.0000 mg | Freq: Four times a day (QID) | INTRAMUSCULAR | Status: DC | PRN
  Filled 2019-06-30: qty 1

## 2019-06-30 MED ORDER — ACETAMINOPHEN 325 MG PO TABS
650.0000 mg | ORAL_TABLET | Freq: Four times a day (QID) | ORAL | Status: DC | PRN
  Administered 2019-06-30 – 2019-07-03 (×6): 650 mg via ORAL
  Filled 2019-06-30 (×6): qty 2

## 2019-06-30 NOTE — ED Notes (Signed)
RN transported to room 256.
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
## 2019-06-30 NOTE — Consult Note (Signed)
Pharmacy Antibiotic Note  Ellen Fleming is a 69 y.o. female admitted on 06/29/2019 with sepsis/PNA.  Pharmacy has been consulted for Vancomycin/Cefepime dosing.  Plan: Will Dose Cefepime 2g q24h   Patient will receive 1g IV Vancomycin loading dose in ED, will follow with  Vancomycin 750 mg IV Q 24 hrs.  Goal AUC 400-550. Expected AUC: 509 SCr used: 1.24   Height: 5\' 3"  (160 cm) Weight: 130 lb (59 kg) IBW/kg (Calculated) : 52.4  Temp (24hrs), Avg:99.9 F (37.7 C), Min:97.6 F (36.4 C), Max:103.1 F (39.5 C)  Recent Labs  Lab 06/29/19 1922 06/29/19 2039  WBC 29.1*  --   CREATININE 1.24*  --   LATICACIDVEN  --  1.5    Estimated Creatinine Clearance: 36.4 mL/min (A) (by C-G formula based on SCr of 1.24 mg/dL (H)).    Allergies  Allergen Reactions  . Sulfa Antibiotics Hives    Antimicrobials this admission: 3/24 Azithromycin/Ceftriaxone x 1 3/24 Cefepime >>  3/24 Vancomycin >>   COVID NEG/FLU NEG  Dose adjustments this admission: None  Microbiology results: 3/24 BCx: pending 3/24 UCx: pending   3/24 MRSA PCR: pending  Thank you for allowing pharmacy to be a part of this patient's care.  4/24, PharmD, BCPS Clinical Pharmacist 06/30/2019 7:49 AM
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
## 2019-06-30 NOTE — Progress Notes (Signed)
CODE SEPSIS - PHARMACY COMMUNICATION  **Broad Spectrum Antibiotics should be administered within 1 hour of Sepsis diagnosis**  Time Code Sepsis Called/Page Received: 2330  Antibiotics Ordered: azithro/ceftriaxone  Time of 1st antibiotic administration: 2349  Additional action taken by pharmacy:   If necessary, Name of Provider/Nurse Contacted:     Thomasene Ripple ,PharmD Clinical Pharmacist  06/30/2019  12:39 AM

## 2019-06-30 NOTE — Consult Note (Signed)
PHARMACY - PHYSICIAN COMMUNICATION CRITICAL VALUE ALERT - BLOOD CULTURE IDENTIFICATION (BCID)  Ellen Fleming is an 69 y.o. female who presented to [facility] on 06/29/2019 with a chief complaint of PNA  Assessment:  1 out of 4 bottles growing GPC.  Name of physician (or Provider) Contacted: Dr. Flossie Dibble  Current antibiotics: cefepime and vancomycin  Changes to prescribed antibiotics recommended: No change.   Patient is on recommended antibiotics - No changes needed  No results found for this or any previous visit.  Ronnald Ramp 06/30/2019  6:12 PM

## 2019-07-01 LAB — CBC WITH DIFFERENTIAL/PLATELET
Abs Immature Granulocytes: 0.26 10*3/uL — ABNORMAL HIGH (ref 0.00–0.07)
Basophils Absolute: 0.1 10*3/uL (ref 0.0–0.1)
Basophils Relative: 1 %
Eosinophils Absolute: 0.1 10*3/uL (ref 0.0–0.5)
Eosinophils Relative: 1 %
HCT: 31.1 % — ABNORMAL LOW (ref 36.0–46.0)
Hemoglobin: 10.3 g/dL — ABNORMAL LOW (ref 12.0–15.0)
Immature Granulocytes: 2 %
Lymphocytes Relative: 12 %
Lymphs Abs: 1.6 10*3/uL (ref 0.7–4.0)
MCH: 30.4 pg (ref 26.0–34.0)
MCHC: 33.1 g/dL (ref 30.0–36.0)
MCV: 91.7 fL (ref 80.0–100.0)
Monocytes Absolute: 1.2 10*3/uL — ABNORMAL HIGH (ref 0.1–1.0)
Monocytes Relative: 9 %
Neutro Abs: 9.9 10*3/uL — ABNORMAL HIGH (ref 1.7–7.7)
Neutrophils Relative %: 75 %
Platelets: 313 10*3/uL (ref 150–400)
RBC: 3.39 MIL/uL — ABNORMAL LOW (ref 3.87–5.11)
RDW: 13.7 % (ref 11.5–15.5)
WBC: 13.1 10*3/uL — ABNORMAL HIGH (ref 4.0–10.5)
nRBC: 0 % (ref 0.0–0.2)

## 2019-07-01 LAB — BASIC METABOLIC PANEL
Anion gap: 4 — ABNORMAL LOW (ref 5–15)
BUN: 16 mg/dL (ref 8–23)
CO2: 23 mmol/L (ref 22–32)
Calcium: 7.6 mg/dL — ABNORMAL LOW (ref 8.9–10.3)
Chloride: 110 mmol/L (ref 98–111)
Creatinine, Ser: 0.82 mg/dL (ref 0.44–1.00)
GFR calc Af Amer: >60 mL/min (ref >60)
GFR calc non Af Amer: >60 mL/min (ref >60)
Glucose, Bld: 86 mg/dL (ref 70–99)
Potassium: 3.6 mmol/L (ref 3.5–5.1)
Sodium: 137 mmol/L (ref 135–145)

## 2019-07-01 LAB — PROCALCITONIN: Procalcitonin: 2.7 ng/mL

## 2019-07-01 LAB — APTT: aPTT: 37 seconds — ABNORMAL HIGH (ref 24–36)

## 2019-07-01 LAB — EXPECTORATED SPUTUM ASSESSMENT W GRAM STAIN, RFLX TO RESP C

## 2019-07-01 LAB — HEMOGLOBIN A1C
Hgb A1c MFr Bld: 5.9 % — ABNORMAL HIGH (ref 4.8–5.6)
Mean Plasma Glucose: 122.63 mg/dL

## 2019-07-01 LAB — URINE CULTURE: Culture: NO GROWTH

## 2019-07-01 LAB — PROTIME-INR
INR: 1.2 (ref 0.8–1.2)
Prothrombin Time: 14.8 seconds (ref 11.4–15.2)

## 2019-07-01 LAB — MRSA PCR SCREENING: MRSA by PCR: NEGATIVE

## 2019-07-01 MED ORDER — SODIUM CHLORIDE 0.9 % IV SOLN
2.0000 g | Freq: Every day | INTRAVENOUS | Status: DC
  Administered 2019-07-01 – 2019-07-03 (×3): 2 g via INTRAVENOUS
  Filled 2019-07-01: qty 20
  Filled 2019-07-01 (×2): qty 2
  Filled 2019-07-01: qty 20

## 2019-07-01 MED ORDER — CALCIUM GLUCONATE-NACL 2-0.675 GM/100ML-% IV SOLN
2.0000 g | Freq: Once | INTRAVENOUS | Status: AC
  Administered 2019-07-01: 09:00:00 2000 mg via INTRAVENOUS
  Filled 2019-07-01: qty 100

## 2019-07-01 NOTE — Progress Notes (Signed)
History and Physical   Patient: Ellen Fleming                            PCP: Baxter Hire, MD                    DOB: 1950/08/31            DOA: 06/29/2019 SMO:[id number]             DOS: 07/01/2019, 9:52 AM  Patient coming from:   HOME  I have personally reviewed patient's medical records, in electronic medical records, including: [place] link, and care everywhere.    Subjective   The patient was seen and examined this morning, stable reporting feeling much better today.  Still having severe generalized weaknesses, shortness of breath with exertion.  But off oxygen, satting 92% on room air.  Hospital course    Ellen Fleming is a 69 y.o. female with medical history significant of anxiety, arthritis, cervical pain, cervical spondylosis, chronic pain syndrome, depression, osteoporosis, osteoarthritis, chronic smoker, lumbar compression fractures, PTSD.  Presenting with: Progressive generalized weakness, body ache, fatigue, dyspnea, progressive cough, subjective fever and chills.  On admission patient was tachypneic with a heart rate of 117, blood pressure 127/96, temp 98.1, satting 95% on room air. Leukocytosis 29,001 noted, chest x-ray consistent with consolidation apical segment of left upper lobe, consistent with pneumonia. Sodium 126 BUN 25 creatinine 1.24, calcium 8.5, lactic acid 1.5, SARS-CoV-2 negative, influenza A/B negative Blood and urine culture was obtained,  patient started on broad-spectrum antibiotics of azithromycin Rocephin >> switched to empiric antibiotic of cefepime and vancomycin 07/01/2019 --1/ 4 blood cultures growing gram-positive cocci likely contaminant continue current antibiotics   ----------------------------------------------------------------------------------------------------------------------  Allergies  Allergen Reactions  . Sulfa Antibiotics Hives    Home MEDs:  Prior to Admission medications   Medication Sig Start Date End Date  Taking? Authorizing Provider  ALPRAZolam Duanne Moron) 0.5 MG tablet Take 0.5 mg by mouth 2 (two) times daily as needed for anxiety.    Yes [provider]  calcium carbonate (CALCIUM 600) 600 MG TABS tablet Take 2 tablets (1,200 mg total) by mouth daily with breakfast. 06/08/19 06/07/20 Yes Milinda Pointer, MD  Cholecalciferol (VITAMIN D3) 125 MCG (5000 UT) CAPS Take 1 capsule (5,000 Units total) by mouth daily with breakfast. Take along with calcium and magnesium. 06/08/19 06/07/20 Yes Milinda Pointer, MD  cyclobenzaprine (FLEXERIL) 10 MG tablet Take 1 tablet (10 mg total) by mouth 3 (three) times daily. 06/08/19 12/05/19 Yes Milinda Pointer, MD  ergocalciferol (VITAMIN D2) 1.25 MG (50000 UT) capsule Take 1 capsule (50,000 Units total) by mouth 2 (two) times a week. X 6 weeks. 06/10/19 09/02/19 Yes Milinda Pointer, MD  hydrochlorothiazide (HYDRODIURIL) 25 MG tablet Take 25 mg by mouth daily.   Yes [provider]  lisinopril (PRINIVIL,ZESTRIL) 20 MG tablet Take 20 mg by mouth daily.   Yes [provider]  Magnesium 500 MG CAPS Take 1 capsule (500 mg total) by mouth daily with breakfast. 06/08/19 06/07/20 Yes Milinda Pointer, MD  sertraline (ZOLOFT) 25 MG tablet Take 25 mg by mouth daily.   Yes [provider]  traMADol (ULTRAM) 50 MG tablet Take 2 tablets (100 mg total) by mouth 3 (three) times daily. Must last 30 days 06/08/19 12/05/19 Yes Milinda Pointer, MD    PRN MEDs: acetaminophen **OR** acetaminophen, albuterol, ALPRAZolam, HYDROcodone-acetaminophen, ketorolac, levalbuterol, magnesium citrate, ondansetron **OR** ondansetron (ZOFRAN)  IV, senna-docusate, sorbitol, traZODone   Physical Exam:   Vitals:   07/01/19 0013 07/01/19 0452 07/01/19 0500 07/01/19 0800  BP: (!) 100/59 107/67  127/70  Pulse: 99 91  (!) 101  Resp: 15 17    Temp: 98.8 F (37.1 C) 98.3 F (36.8 C)  97.7 F (36.5 C)  TempSrc: Oral Oral  Oral  SpO2: 95% 92%  98%  Weight:   62.6 kg    Height:       BP 127/70 (BP Location: Left Arm)   Pulse (!) 101   Temp 97.7 F (36.5 C) (Oral)   Resp 17   Ht '5\' 3"'  (1.6 m)   Wt 62.6 kg   SpO2 98%   BMI 24.45 kg/m    Physical Exam  Constitution:  Alert, cooperative, no distress,  Psychiatric: Normal and stable mood and affect, cognition intact,   HEENT: Normocephalic, PERRL, otherwise with in Normal limits  Chest:Chest symmetric Cardio vascular:  S1/S2, RRR, No murmure, No Rubs or Gallops  pulmonary: Clear to auscultation bilaterally, respirations unlabored, negative wheezes / crackles Abdomen: Soft, non-tender, non-distended, bowel sounds,no masses, no organomegaly Muscular skeletal: Limited exam - in bed, able to move all 4 extremities, Normal strength,  Neuro: CNII-XII intact. , normal motor and sensation, reflexes intact  Extremities: No pitting edema lower extremities, +2 pulses  Skin: Dry, warm to touch, negative for any Rashes, No open wounds Wounds: per nursing documentation      Labs on admission:    I have personally reviewed following labs and imaging studies  CBC: Recent Labs  Lab 06/29/19 1922 07/01/19 0915  WBC 29.1* 13.1*  NEUTROABS 25.0* 9.9*  HGB 11.1* 10.3*  HCT 31.5* 31.1*  MCV 87.3 91.7  PLT 296 053   Basic Metabolic Panel: Recent Labs  Lab 06/29/19 1922 06/30/19 0802 07/01/19 0452  NA 126*  --  137  K 3.8  --  3.6  CL 95*  --  110  CO2 17*  --  23  GLUCOSE 127*  --  86  BUN 25*  --  16  CREATININE 1.24* 1.05* 0.82  CALCIUM 8.5* 7.5* 7.6*  MG  --  2.0  --   PHOS  --  2.7  --    CBG: Recent Labs  Lab 06/30/19 0815  GLUCAP 119*   Lipid Profile: No results for input(s): CHOL, HDL, LDLCALC, TRIG, CHOLHDL, LDLDIRECT in the last 72 hours. Thyroid Function Tests: Recent Labs    06/30/19 0802  TSH 1.658   Anemia Panel: No results for input(s): VITAMINB12, FOLATE, FERRITIN, TIBC, IRON, RETICCTPCT in the last 72 hours. Urine analysis:    Component Value Date/Time    COLORURINE YELLOW (A) 06/29/2019 2200   APPEARANCEUR CLEAR (A) 06/29/2019 2200   APPEARANCEUR Hazy 02/28/2014 0922   LABSPEC 1.015 06/29/2019 2200   LABSPEC 1.016 02/28/2014 0922   PHURINE 6.0 06/29/2019 2200   GLUCOSEU NEGATIVE 06/29/2019 2200   GLUCOSEU Negative 02/28/2014 0922   HGBUR NEGATIVE 06/29/2019 2200   BILIRUBINUR NEGATIVE 06/29/2019 2200   BILIRUBINUR Negative 02/28/2014 0922   KETONESUR 5 (A) 06/29/2019 2200   PROTEINUR NEGATIVE 06/29/2019 2200   NITRITE NEGATIVE 06/29/2019 2200   LEUKOCYTESUR TRACE (A) 06/29/2019 2200   LEUKOCYTESUR Trace 02/28/2014 0922     Radiologic Exams on Admission:   DG Chest 2 View  Result Date: 06/29/2019 CLINICAL DATA:  Cough and fever EXAM: CHEST - 2 VIEW COMPARISON:  December 04, 2017 FINDINGS: There is airspace consolidation in the apical  segment of the left upper lobe. The lungs elsewhere are clear. The heart size and pulmonary vascularity are normal. No adenopathy. No bone lesions. IMPRESSION: Airspace consolidation most likely representing pneumonia in the apical segment of the left upper lobe. Lungs elsewhere clear. Heart size normal. No adenopathy. Followup PA and lateral chest radiographs recommended in 3-4 weeks following trial of antibiotic therapy to ensure resolution and exclude underlying malignancy. These results will be called to the ordering clinician or representative by the Radiologist Assistant, and communication documented in the PACS or [employer]. Electronically Signed   By: Lowella Grip III M.D.   On: 06/29/2019 19:09    EKG:   Independently reviewed.  Orders placed or performed during the hospital encounter of 06/29/19  . ED EKG  . ED EKG  . ED EKG 12-Lead  . ED EKG 12-Lead  . EKG 12-Lead  . EKG   --------------------------------------------------------------------------------------------------------------------------    Assessment / Plan:   Active Problems:   Sepsis ([place])  Sepsis due to  pneumonia 07/01/2019: Patient sinus symptoms is improved, afebrile, normotensive, satting 92% room air 1/4 blood cultures growing GPC, likely contaminant, will continue current antibiotics of cefepime and vancomycin -Status post IV fluid resuscitation-stable IV fluid will be DC'd   -On admission:patient met the criteria for sepsis due to leukocytosis, hyponatremia, tachycardia, -Leukocytosis 29.1 >>> 13.1 chest x-ray consistent with consolidation apical segment of left upper lobe, consistent with pneumonia. Sodium 126 BUN 25 creatinine 1.24, calcium 8.5, lactic acid 1.5, SARS-CoV-2 negative, influenza A/B negative Blood and urine culture will be followed accordingly -Continue orders per sepsis protocol, following labs including lactic acid, patient started on broad-spectrum antibiotics of azithromycin Rocephin Antibiotic will be broadened to cefepime and vancomycin for now. Will taper down accordingly   History of arthralgia due to osteoarthritis, rheumatoid arthritis, chronic pain syndrome -Patient home medications reviewed, continue Ultram, Continue calcium and vitamin D supplements  Depression, anxiety, PTSD -Medications reviewed, continue as needed Xanax, Zoloft,   Chronic tobacco abuse -Counseled regarding cessation of tobacco -NicoDerm has been provided  ----------------------------------------------------------------------------------------------------------------------------  DVT prophylaxis: SCD/Compression stockings and Heparin SQ Code Status:   Code Status: Full Code Family Communication:  none at bedside  The above findings and plan of care has been discussed with patient in detail, the patient expressed understanding and agreement of above plan.   Disposition Plan: Continue to improve likely will be discharged home in next 24-48 hours PT/OT/social worker consult for social needs, home health needs  Consults called:  None  Admission status: Patient will be  admitted as Inpatient, with a greater than 2 midnight length of stay.  Patient meets the sepsis criteria, needing aggressive IV fluid resuscitation, IV antibiotics  ---------------------------------------------------------------------------------------------------------------------------  Cultures:  -Blood cultures x4 >> 1/4 blood cultures growing gram-positive cocci -Urine culture >> negative to date Attempted sputum culture >> Nasopharyngeal swab negative for MRSA Antimicrobial: -Azithromycin Rocephin x1 06/29/2019 x1 dose - Cefepime vancomycin 06/30/2019 >>>  ---------------------------------------------------------------------------------------------------------------------------------------------------------------------------------------------------------------------------------------  Time spent:   59  Min.   SIGNED: Deatra James, MD, FACP, FHM. [facility],  Pager [phone]  If 7PM-7AM, please contact night-coverage [email or web address] Ota [facility] [address] 07/01/2019, 9:52 AM

## 2019-07-02 DIAGNOSIS — A409 Streptococcal sepsis, unspecified: Secondary | ICD-10-CM

## 2019-07-02 LAB — BASIC METABOLIC PANEL
Anion gap: 7 (ref 5–15)
BUN: 11 mg/dL (ref 8–23)
CO2: 24 mmol/L (ref 22–32)
Calcium: 8.4 mg/dL — ABNORMAL LOW (ref 8.9–10.3)
Chloride: 106 mmol/L (ref 98–111)
Creatinine, Ser: 0.76 mg/dL (ref 0.44–1.00)
GFR calc Af Amer: >60 mL/min (ref >60)
GFR calc non Af Amer: >60 mL/min (ref >60)
Glucose, Bld: 97 mg/dL (ref 70–99)
Potassium: 3.8 mmol/L (ref 3.5–5.1)
Sodium: 137 mmol/L (ref 135–145)

## 2019-07-02 LAB — GLUCOSE, CAPILLARY: Glucose-Capillary: 86 mg/dL (ref 70–99)

## 2019-07-02 LAB — CBC
HCT: 27.4 % — ABNORMAL LOW (ref 36.0–46.0)
Hemoglobin: 9.2 g/dL — ABNORMAL LOW (ref 12.0–15.0)
MCH: 30.4 pg (ref 26.0–34.0)
MCHC: 33.6 g/dL (ref 30.0–36.0)
MCV: 90.4 fL (ref 80.0–100.0)
Platelets: 302 10*3/uL (ref 150–400)
RBC: 3.03 MIL/uL — ABNORMAL LOW (ref 3.87–5.11)
RDW: 13.7 % (ref 11.5–15.5)
WBC: 10.8 10*3/uL — ABNORMAL HIGH (ref 4.0–10.5)
nRBC: 0 % (ref 0.0–0.2)

## 2019-07-02 LAB — CULTURE, BLOOD (ROUTINE X 2): Special Requests: ADEQUATE

## 2019-07-02 LAB — CALCIUM, IONIZED: Calcium, Ionized, Serum: 5.3 mg/dL (ref 4.5–5.6)

## 2019-07-02 NOTE — Care Management Important Message (Signed)
Important Message  Patient Details  Name: Ellen Fleming MRN: [id number] Date of Birth: [birth year]   Medicare Important Message Given:  Yes     [name] 07/02/2019, 11:47 AM

## 2019-07-02 NOTE — Progress Notes (Signed)
History and Physical   Patient: Ellen Fleming                            PCP: Baxter Hire, MD                    DOB: [birth year]            DOA: 06/29/2019 RXV:[id number]             DOS: 07/02/2019, 11:46 AM  Patient coming from:   HOME  I have personally reviewed patient's medical records, in electronic medical records, including: [place] link, and care everywhere.    Subjective   The patient was seen and examined this morning, remained stable, afebrile normotensive Patient was made aware of 1 out of 4 blood cultures were positive for bacteria,  She was made aware that we will repeat cultures, she needs to continue IV antibiotics for now Currently stable, no complaints no issues overnight   Hospital course    Ellen Fleming is a 69 y.o. female with medical history significant of anxiety, arthritis, cervical pain, cervical spondylosis, chronic pain syndrome, depression, osteoporosis, osteoarthritis, chronic smoker, lumbar compression fractures, PTSD.  Presenting with: Progressive generalized weakness, body ache, fatigue, dyspnea, progressive cough, subjective fever and chills.  On admission patient was tachypneic with a heart rate of 117, blood pressure 127/96, temp 98.1, satting 95% on room air. Leukocytosis 29,001 noted, chest x-ray consistent with consolidation apical segment of left upper lobe, consistent with pneumonia. Sodium 126 BUN 25 creatinine 1.24, calcium 8.5, lactic acid 1.5, SARS-CoV-2 negative, influenza A/B negative Blood and urine culture was obtained,  patient started on broad-spectrum antibiotics of azithromycin Rocephin >> switched to empiric antibiotic of cefepime and vancomycin  07/01/2019 --1/ 4 blood cultures growing gram-positive cocci likely contaminant continue current antibiotics  07/02/2019 -patient remained stable -  1/4 blood cultures revealed Streptococcus pneumonia , will continue current antibiotics of cefepime and vancomycin --repeating  blood cultures today  ----------------------------------------------------------------------------------------------------------------------  Allergies  Allergen Reactions  . Sulfa Antibiotics Hives    Home MEDs:  Prior to Admission medications   Medication Sig Start Date End Date Taking? Authorizing Provider  ALPRAZolam Duanne Moron) 0.5 MG tablet Take 0.5 mg by mouth 2 (two) times daily as needed for anxiety.    Yes [provider]  calcium carbonate (CALCIUM 600) 600 MG TABS tablet Take 2 tablets (1,200 mg total) by mouth daily with breakfast. 06/08/19 06/07/20 Yes Milinda Pointer, MD  Cholecalciferol (VITAMIN D3) 125 MCG (5000 UT) CAPS Take 1 capsule (5,000 Units total) by mouth daily with breakfast. Take along with calcium and magnesium. 06/08/19 06/07/20 Yes Milinda Pointer, MD  cyclobenzaprine (FLEXERIL) 10 MG tablet Take 1 tablet (10 mg total) by mouth 3 (three) times daily. 06/08/19 12/05/19 Yes Milinda Pointer, MD  ergocalciferol (VITAMIN D2) 1.25 MG (50000 UT) capsule Take 1 capsule (50,000 Units total) by mouth 2 (two) times a week. X 6 weeks. 06/10/19 09/02/19 Yes Milinda Pointer, MD  hydrochlorothiazide (HYDRODIURIL) 25 MG tablet Take 25 mg by mouth daily.   Yes [provider]  lisinopril (PRINIVIL,ZESTRIL) 20 MG tablet Take 20 mg by mouth daily.   Yes [provider]  Magnesium 500 MG CAPS Take 1 capsule (500 mg total) by mouth daily with breakfast. 06/08/19 06/07/20 Yes Milinda Pointer, MD  sertraline (ZOLOFT) 25 MG tablet Take 25 mg by mouth daily.   Yes [provider]  traMADol (  ULTRAM) 50 MG tablet Take 2 tablets (100 mg total) by mouth 3 (three) times daily. Must last 30 days 06/08/19 12/05/19 Yes Milinda Pointer, MD    PRN MEDs: acetaminophen **OR** acetaminophen, albuterol, ALPRAZolam, HYDROcodone-acetaminophen, ketorolac, levalbuterol, magnesium citrate, ondansetron **OR** ondansetron (ZOFRAN) IV, senna-docusate, sorbitol,  traZODone   Physical Exam:   Vitals:   07/01/19 1521 07/01/19 1943 07/02/19 0448 07/02/19 0814  BP: (!) 112/51 113/74 128/76 110/82  Pulse: 98 100 (!) 108 93  Resp: '19 19 18   ' Temp: 98.2 F (36.8 C) 98.3 F (36.8 C) 98.6 F (37 C) 98.3 F (36.8 C)  TempSrc: Oral Oral Oral Oral  SpO2: 98% 97% 93% 98%  Weight:   62.8 kg   Height:      BP 110/82 (BP Location: Left Arm)   Pulse 93   Temp 98.3 F (36.8 C) (Oral)   Resp 18   Ht '5\' 3"'  (1.6 m)   Wt 62.8 kg   SpO2 98%   BMI 24.53 kg/m    Physical Exam  Constitution:  Alert, cooperative, no distress,  Psychiatric: Normal and stable mood and affect, cognition intact,   HEENT: Normocephalic, PERRL, otherwise with in Normal limits  Chest:Chest symmetric Cardio vascular:  S1/S2, RRR, No murmure, No Rubs or Gallops  pulmonary: Clear to auscultation bilaterally, respirations unlabored, negative wheezes / crackles Abdomen: Soft, non-tender, non-distended, bowel sounds,no masses, no organomegaly Muscular skeletal: Limited exam - in bed, able to move all 4 extremities, Normal strength,  Neuro: CNII-XII intact. , normal motor and sensation, reflexes intact  Extremities: No pitting edema lower extremities, +2 pulses  Skin: Dry, warm to touch, negative for any Rashes, No open wounds Wounds: per nursing documentation     Labs on admission:    I have personally reviewed following labs and imaging studies  CBC: Recent Labs  Lab 06/29/19 1922 07/01/19 0915 07/02/19 0636  WBC 29.1* 13.1* 10.8*  NEUTROABS 25.0* 9.9*  --   HGB 11.1* 10.3* 9.2*  HCT 31.5* 31.1* 27.4*  MCV 87.3 91.7 90.4  PLT 296 313 686   Basic Metabolic Panel: Recent Labs  Lab 06/29/19 1922 06/30/19 0802 07/01/19 0452 07/02/19 0636  NA 126*  --  137 137  K 3.8  --  3.6 3.8  CL 95*  --  110 106  CO2 17*  --  23 24  GLUCOSE 127*  --  86 97  BUN 25*  --  16 11  CREATININE 1.24* 1.05* 0.82 0.76  CALCIUM 8.5* 7.5* 7.6* 8.4*  MG  --  2.0  --   --   PHOS   --  2.7  --   --    CBG: Recent Labs  Lab 06/30/19 0815 07/02/19 0816  GLUCAP 119* 86   Lipid Profile: No results for input(s): CHOL, HDL, LDLCALC, TRIG, CHOLHDL, LDLDIRECT in the last 72 hours. Thyroid Function Tests: Recent Labs    06/30/19 0802  TSH 1.658   Anemia Panel: No results for input(s): VITAMINB12, FOLATE, FERRITIN, TIBC, IRON, RETICCTPCT in the last 72 hours. Urine analysis:    Component Value Date/Time   COLORURINE YELLOW (A) 06/29/2019 2200   APPEARANCEUR CLEAR (A) 06/29/2019 2200   APPEARANCEUR Hazy 02/28/2014 0922   LABSPEC 1.015 06/29/2019 2200   LABSPEC 1.016 02/28/2014 0922   PHURINE 6.0 06/29/2019 2200   GLUCOSEU NEGATIVE 06/29/2019 2200   GLUCOSEU Negative 02/28/2014 0922   HGBUR NEGATIVE 06/29/2019 2200   BILIRUBINUR NEGATIVE 06/29/2019 2200   BILIRUBINUR Negative 02/28/2014 1683  KETONESUR 5 (A) 06/29/2019 2200   PROTEINUR NEGATIVE 06/29/2019 2200   NITRITE NEGATIVE 06/29/2019 2200   LEUKOCYTESUR TRACE (A) 06/29/2019 2200   LEUKOCYTESUR Trace 02/28/2014 1448     Radiologic Exams on Admission:   No results found.  EKG:   Independently reviewed.  Orders placed or performed during the hospital encounter of 06/29/19  . ED EKG  . ED EKG  . ED EKG 12-Lead  . ED EKG 12-Lead  . EKG 12-Lead  . EKG   --------------------------------------------------------------------------------------------------------------------------    Assessment / Plan:   Active Problems:   Sepsis ([place])  Sepsis due to pneumonia 07/02/2019: Patient sinus symptoms is improved, afebrile, normotensive, satting 92% room air  1/4 blood cultures revealed Streptococcus pneumonia , will continue current antibiotics of cefepime and vancomycin -We will repeat blood cultures today 07/02/2019 --- if no growth in next 24 hours we will modify patient's medication to p.o., likely will be discharged home.  -Status post IV fluid resuscitation-stable IV fluid will be DC'd on  07/02/2019 -On admission:patient met the criteria for sepsis due to leukocytosis, hyponatremia, tachycardia, -Leukocytosis 29.1 >>> 13.1 >> 10.8 -  lactic acid 1.5,  chest x-ray consistent with consolidation apical segment of left upper lobe, consistent with pneumonia.   SARS-CoV-2 negative, influenza A/B negative Blood and urine culture will be followed accordingly -Continue orders per sepsis protocol, following labs including lactic acid, patient started on broad-spectrum antibiotics of azithromycin Rocephin Antibiotic will be broadened to cefepime and vancomycin for now. Will taper down accordingly   History of arthralgia due to osteoarthritis, rheumatoid arthritis, chronic pain syndrome -Patient home medications reviewed, continue Ultram, Continue calcium and vitamin D supplements  Depression, anxiety, PTSD -Medications reviewed, continue as needed Xanax, Zoloft,   Chronic tobacco abuse -Counseled regarding cessation of tobacco -NicoDerm has been provided  ----------------------------------------------------------------------------------------------------------------------------  DVT prophylaxis: SCD/Compression stockings and Heparin SQ Code Status:   Code Status: Full Code Family Communication:  none at bedside  The above findings and plan of care has been discussed with patient in detail, the patient expressed understanding and agreement of above plan.   Disposition Plan: Continue to improve likely will be discharged home in next 24-48 hours PT/OT/social worker consult for social needs, home health needs  Consults called:  None  Admission status: Patient will be admitted as Inpatient, with a greater than 2 midnight length of stay.  Patient meets the sepsis criteria, needing aggressive IV fluid resuscitation, IV antibiotics  ---------------------------------------------------------------------------------------------------------------------------  Cultures:   -3/23/21Blood cultures x4 >> 1/4 blood cultures revealed Streptococcus pneumonia , w 07/02/19 repeat blood cultures x2 >>>   -Urine culture >> negative to date Attempted sputum culture >> moderate WBC, moderate yeast, cultures negative to date  Nasopharyngeal swab negative for MRSA Antimicrobial: -Azithromycin Rocephin x1 06/29/2019 x1 dose - Cefepime vancomycin 06/30/2019 >>>  ---------------------------------------------------------------------------------------------------------------------------------------------------------------------------------------------------------------------------------------  Time spent:   58  Min.   SIGNED: Deatra James, MD, FACP, FHM. [facility],  Pager [phone]  If 7PM-7AM, please contact night-coverage [email or web address] Ota [facility] 07/02/2019, 11:46 AM
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
## 2019-07-02 NOTE — Plan of Care (Signed)

## 2019-07-03 ENCOUNTER — Inpatient Hospital Stay: Payer: Medicare Other

## 2019-07-03 DIAGNOSIS — A419 Sepsis, unspecified organism: Secondary | ICD-10-CM

## 2019-07-03 LAB — GLUCOSE, CAPILLARY
Glucose-Capillary: 94 mg/dL (ref 70–99)
Glucose-Capillary: 110 mg/dL — ABNORMAL HIGH (ref 70–99)

## 2019-07-03 LAB — BASIC METABOLIC PANEL
Anion gap: 10 (ref 5–15)
BUN: 8 mg/dL (ref 8–23)
CO2: 27 mmol/L (ref 22–32)
Calcium: 8.6 mg/dL — ABNORMAL LOW (ref 8.9–10.3)
Chloride: 101 mmol/L (ref 98–111)
Creatinine, Ser: 0.66 mg/dL (ref 0.44–1.00)
GFR calc Af Amer: >60 mL/min (ref >60)
GFR calc non Af Amer: >60 mL/min (ref >60)
Glucose, Bld: 101 mg/dL — ABNORMAL HIGH (ref 70–99)
Potassium: 3.3 mmol/L — ABNORMAL LOW (ref 3.5–5.1)
Sodium: 138 mmol/L (ref 135–145)

## 2019-07-03 LAB — CBC
HCT: 28.3 % — ABNORMAL LOW (ref 36.0–46.0)
Hemoglobin: 9.7 g/dL — ABNORMAL LOW (ref 12.0–15.0)
MCH: 30.4 pg (ref 26.0–34.0)
MCHC: 34.3 g/dL (ref 30.0–36.0)
MCV: 88.7 fL (ref 80.0–100.0)
Platelets: 347 10*3/uL (ref 150–400)
RBC: 3.19 MIL/uL — ABNORMAL LOW (ref 3.87–5.11)
RDW: 13.5 % (ref 11.5–15.5)
WBC: 10.9 10*3/uL — ABNORMAL HIGH (ref 4.0–10.5)
nRBC: 0 % (ref 0.0–0.2)

## 2019-07-03 MED ORDER — KETOROLAC TROMETHAMINE 15 MG/ML IJ SOLN
15.0000 mg | Freq: Four times a day (QID) | INTRAMUSCULAR | Status: DC
  Administered 2019-07-03 – 2019-07-04 (×4): 15 mg via INTRAVENOUS
  Filled 2019-07-03 (×4): qty 1

## 2019-07-03 MED ORDER — POTASSIUM CHLORIDE CRYS ER 20 MEQ PO TBCR
40.0000 meq | EXTENDED_RELEASE_TABLET | Freq: Once | ORAL | Status: AC
  Administered 2019-07-03: 40 meq via ORAL
  Filled 2019-07-03: qty 2

## 2019-07-03 MED ORDER — SODIUM CHLORIDE 0.9 % IV SOLN
INTRAVENOUS | Status: DC | PRN
  Administered 2019-07-03 – 2019-07-04 (×2): 75 mL via INTRAVENOUS

## 2019-07-03 NOTE — Plan of Care (Signed)
  Problem: Clinical Measurements: Goal: Respiratory complications will improve Outcome: Progressing   Problem: Clinical Measurements: Goal: Signs and symptoms of infection will decrease Outcome: Progressing

## 2019-07-03 NOTE — Care Management (Signed)
No home health agency will accept patient based on [facility] payer.  Checked with [facility], [facility], [facility], [facility], [facility], [facility], [facility]. MD notified.

## 2019-07-03 NOTE — Progress Notes (Signed)
[facility] Progress Note  Patient: Ellen Fleming    XBM:[id number]  DOA: 06/29/2019     Date of Service: the patient was seen and examined on 07/03/2019  Chief Complaint  Patient presents with  . Pneumonia   Brief hospital course: Past medical history of anxiety, arthritis, chronic smoking, PTSD.  Presented with complaints of cough and shortness of breath.  Found to have strep bacteremia with pneumonia Currently further plan is continue antibiotics.  Assessment and Plan: 1.  Sepsis POA due to Streptococcus pneumonia and bacteremia. Follow-up culture so far negative. Continue with IV ceftriaxone today. Transition to oral antibiotics tomorrow.  Still patient reports chest pain today. Repeat chest x-ray on 07/03/2019 shows no evidence of pleural effusion or any other acute abnormality. Continue scheduled Toradol. Covid negative influenza negative.  Apparently patient actually had left lobar pneumonia in 2019 as well. Patient will benefit from outpatient CT scanning after completion of the antibiotics in 4 to 6 weeks to ensure resolution.  2.  History of arthralgia and osteoarthritis. Continue home regimen of Ultram.  3.  Depression.  Anxiety.  Previously. Continue home regimen.  4.  Chronic smoker patient Nicotine patch.  5.  Hypokalemia. Corrected.  6.  Anemia likely from chronic nutritional deficiency. Monitor.  Outpatient work-up with PCP.  7.  Hyponatremia. Currently sodium improving.  Monitor.  8.  Acute kidney injury. On presentation serum creatinine 1.24. Currently serum creatinine 0.66. Monitor.  Patient  9.  Metabolic acidosis. Likely in the setting of AKI.  Monitor.  Diet: Regular diet DVT Prophylaxis: Subcutaneous Heparin    Advance goals of care discussion: Full code  Family Communication: family was present at bedside, at the time of interview.  The pt provided permission to discuss medical plan with the family. Opportunity was given to ask  question and all questions were answered satisfactorily.   Disposition:  Pt is from home, admitted with strep bacteremia with sepsis, still has ongoing need for IV antibiotics, which precludes a safe discharge. Discharge to home, when likely tomorrow.  Subjective: Reports severe left-sided chest pain.  No nausea no vomiting.  No fever no chills.  No pain abdomen otherwise.  Physical Exam: General:  alert oriented to time, place, and person.  Appear in moderate distress, affect appropriate Eyes: PERRL ENT: Oral Mucosa Clear, moist  Neck: no JVD,  Cardiovascular: S1 and S2 Present, aortic systolic  Murmur,  Respiratory: good respiratory effort, Bilateral Air entry equal and Decreased, bilateral  Crackles, no wheezes Abdomen: Bowel Sound present, Soft and no tenderness,  Skin: no rash Extremities: no Pedal edema, no calf tenderness Neurologic: without any new focal findings Gait not checked due to patient safety concerns  Vitals:   07/03/19 0421 07/03/19 0806 07/03/19 1152 07/03/19 1607  BP:  133/78 (!) 155/75 (!) 152/96  Pulse:  98 (!) 110 (!) 109  Resp:  20 19 20   Temp:  98.8 F (37.1 C) 98.4 F (36.9 C) 98.7 F (37.1 C)  TempSrc:  Oral Oral Oral  SpO2:  94% 100% 98%  Weight: 61.1 kg     Height:        Intake/Output Summary (Last 24 hours) at 07/03/2019 1949 Last data filed at 07/03/2019 1815 Gross per 24 hour  Intake 240 ml  Output 1200 ml  Net -960 ml   Filed Weights   07/01/19 0500 07/02/19 0448 07/03/19 0421  Weight: 62.6 kg 62.8 kg 61.1 kg    Data Reviewed: I have personally reviewed and interpreted daily  labs, tele strips, imagings as discussed above. I reviewed all nursing notes, pharmacy notes, vitals, pertinent old records I have discussed plan of care as described above with RN and patient/family.  CBC: Recent Labs  Lab 06/29/19 1922 07/01/19 0915 07/02/19 0636 07/03/19 0544  WBC 29.1* 13.1* 10.8* 10.9*  NEUTROABS 25.0* 9.9*  --   --   HGB 11.1*  10.3* 9.2* 9.7*  HCT 31.5* 31.1* 27.4* 28.3*  MCV 87.3 91.7 90.4 88.7  PLT 296 313 302 347   Basic Metabolic Panel: Recent Labs  Lab 06/29/19 1922 06/30/19 0802 07/01/19 0452 07/02/19 0636 07/03/19 0544  NA 126*  --  137 137 138  K 3.8  --  3.6 3.8 3.3*  CL 95*  --  110 106 101  CO2 17*  --  23 24 27   GLUCOSE 127*  --  86 97 101*  BUN 25*  --  16 11 8   CREATININE 1.24* 1.05* 0.82 0.76 0.66  CALCIUM 8.5* 7.5* 7.6* 8.4* 8.6*  MG  --  2.0  --   --   --   PHOS  --  2.7  --   --   --     Studies: DG Chest Port 1 View  Result Date: 07/03/2019 CLINICAL DATA:  Left chest pain EXAM: PORTABLE CHEST 1 VIEW COMPARISON:  June 29, 2019 FINDINGS: The heart size and mediastinal contours are within normal limits. Persistent patchy opacity of the medial left apex is noted. Lungs are hyperinflated the visualized skeletal structures are unremarkable. IMPRESSION: Persistent patchy opacity of the medial left apex. Electronically Signed   By: 07/05/2019 M.D.   On: 07/03/2019 13:49    Scheduled Meds: . calcium carbonate  1,000 mg Oral Q breakfast  . cholecalciferol  5,000 Units Oral Q breakfast  . docusate sodium  100 mg Oral BID  . heparin injection (subcutaneous)  5,000 Units Subcutaneous Q8H  . ketorolac  15 mg Intravenous QID  . magnesium oxide  400 mg Oral Q breakfast  . sertraline  25 mg Oral Daily  . sodium chloride flush  3 mL Intravenous Q12H  . traMADol  100 mg Oral TID  . Vitamin D (Ergocalciferol)  50,000 Units Oral Once per day on Mon Thu   Continuous Infusions: . sodium chloride 75 mL (07/03/19 1002)  . cefTRIAXone (ROCEPHIN)  IV 2 g (07/03/19 1003)   PRN Meds: sodium chloride, acetaminophen **OR** acetaminophen, albuterol, ALPRAZolam, HYDROcodone-acetaminophen, levalbuterol, magnesium citrate, ondansetron **OR** ondansetron (ZOFRAN) IV, senna-docusate, sorbitol, traZODone  Time spent: 35 minutes  Author: 07/05/19, MD [facility] 07/03/2019 7:49 PM  To reach  On-call, see care teams to locate the attending and reach out to them via [email or web address]. If 7PM-7AM, please contact night-coverage If you still have difficulty reaching the attending provider, please page the [facility] (Director on Call) for [facility] on amion for assistance.

## 2019-07-03 NOTE — TOC Transition Note (Signed)
Transition of Care [facility]) - CM/SW Discharge Note   Patient Details  Name: Ellen Fleming MRN: [id number] Date of Birth: [birth year]  [facility] of [facility] CM/SW Contact:  Collie Siad, RN Phone Number: 07/03/2019, 11:24 AM   Clinical Narrative:     [facility] has accepted patient. MD updated. Daughter and patient updated.   Final next level of care: Home w Home Health Services Barriers to Discharge: No Barriers Identified   Patient Goals and CMS Choice        Discharge Placement                       Discharge Plan and Services                                     Social Determinants of Health (SDOH) Interventions     Readmission Risk Interventions No flowsheet data found.

## 2019-07-04 DIAGNOSIS — J189 Pneumonia, unspecified organism: Secondary | ICD-10-CM

## 2019-07-04 LAB — CBC
HCT: 32.9 % — ABNORMAL LOW (ref 36.0–46.0)
Hemoglobin: 11.6 g/dL — ABNORMAL LOW (ref 12.0–15.0)
MCH: 30.4 pg (ref 26.0–34.0)
MCHC: 35.3 g/dL (ref 30.0–36.0)
MCV: 86.4 fL (ref 80.0–100.0)
Platelets: 428 10*3/uL — ABNORMAL HIGH (ref 150–400)
RBC: 3.81 MIL/uL — ABNORMAL LOW (ref 3.87–5.11)
RDW: 13.2 % (ref 11.5–15.5)
WBC: 14.4 10*3/uL — ABNORMAL HIGH (ref 4.0–10.5)
nRBC: 0 % (ref 0.0–0.2)

## 2019-07-04 LAB — BASIC METABOLIC PANEL
Anion gap: 14 (ref 5–15)
BUN: 8 mg/dL (ref 8–23)
CO2: 25 mmol/L (ref 22–32)
Calcium: 9 mg/dL (ref 8.9–10.3)
Chloride: 97 mmol/L — ABNORMAL LOW (ref 98–111)
Creatinine, Ser: 0.51 mg/dL (ref 0.44–1.00)
GFR calc Af Amer: >60 mL/min (ref >60)
GFR calc non Af Amer: >60 mL/min (ref >60)
Glucose, Bld: 120 mg/dL — ABNORMAL HIGH (ref 70–99)
Potassium: 3.3 mmol/L — ABNORMAL LOW (ref 3.5–5.1)
Sodium: 136 mmol/L (ref 135–145)

## 2019-07-04 LAB — GLUCOSE, CAPILLARY
Glucose-Capillary: 111 mg/dL — ABNORMAL HIGH (ref 70–99)
Glucose-Capillary: 122 mg/dL — ABNORMAL HIGH (ref 70–99)

## 2019-07-04 LAB — CULTURE, RESPIRATORY W GRAM STAIN

## 2019-07-04 MED ORDER — AMOXICILLIN-POT CLAVULANATE 875-125 MG PO TABS: 1.0000 | ORAL_TABLET | Freq: Two times a day (BID) | ORAL | 0 refills | Status: AC

## 2019-07-04 MED ORDER — METOPROLOL TARTRATE 25 MG PO TABS: 12.5000 mg | ORAL_TABLET | Freq: Two times a day (BID) | ORAL | 0 refills | Status: DC

## 2019-07-04 MED ORDER — NAPROXEN 500 MG PO TBEC: 500.0000 mg | DELAYED_RELEASE_TABLET | Freq: Two times a day (BID) | ORAL | 0 refills | Status: AC

## 2019-07-04 MED ORDER — SODIUM CHLORIDE 0.9 % IV SOLN
2.0000 g | Freq: Every day | INTRAVENOUS | Status: AC
  Administered 2019-07-04: 10:00:00 2 g via INTRAVENOUS
  Filled 2019-07-04: qty 20

## 2019-07-04 MED ORDER — CEFDINIR 300 MG PO CAPS: 300.0000 mg | ORAL_CAPSULE | Freq: Two times a day (BID) | ORAL | Status: DC

## 2019-07-04 MED ORDER — SODIUM CHLORIDE 0.9 % IV SOLN
2.0000 g | Freq: Every day | INTRAVENOUS | Status: DC
  Filled 2019-07-04: qty 20

## 2019-07-04 MED ORDER — POTASSIUM CHLORIDE CRYS ER 20 MEQ PO TBCR
40.0000 meq | EXTENDED_RELEASE_TABLET | Freq: Once | ORAL | Status: AC
  Administered 2019-07-04: 40 meq via ORAL
  Filled 2019-07-04: qty 2

## 2019-07-04 MED ORDER — DOCUSATE SODIUM 100 MG PO CAPS: 100.0000 mg | ORAL_CAPSULE | Freq: Two times a day (BID) | ORAL | 0 refills | Status: DC | PRN

## 2019-07-04 NOTE — Discharge Summary (Addendum)
[facility] Discharge Summary   Patient: Ellen Fleming ASN:[id number]  PCP: Baxter Hire, MD  Date of admission: 06/29/2019   Date of discharge:  07/04/2019     Discharge Diagnoses:  Principal diagnosis Sepsis secondary to strep pneumoniae pneumonia and bacteremia.  Active Problems:   Sepsis ([place])   Admitted From: Home Disposition:  Home with home health  Recommendations for Outpatient Follow-up:  1. PCP: Follow-up with PCP in 1 week 2. Follow up LABS/TEST: CT scan of chest in 6 weeks to ensure resolution of pneumonia  Follow-up Information    Baxter Hire, MD. Schedule an appointment as soon as possible for a visit in 1 week(s).   Specialty: Internal Medicine Why: needs a CT chest in 4-6 weeks to ensure resolution and rule out any cause for recurrent left sided pneumonia.  Contact information: [place] [address] [phone]        [facility], [facility] Follow up.   Specialty: Home Health Services Why: Physical Therapy, Nurse         Diet recommendation: Cardiac diet  Activity: The patient is advised to gradually reintroduce usual activities, as tolerated  Discharge Condition: stable  Code Status: Full code   History of present illness: As per the H and P dictated on admission, "Ellen Fleming is a 69 y.o. female with medical history significant of anxiety, arthritis, cervical pain, cervical spondylosis, chronic pain syndrome, depression, osteoporosis, osteoarthritis, chronic smoker, lumbar compression fractures, PTSD.  Presenting with: Progressive generalized weakness, body ache, fatigue, dyspnea, progressive cough, subjective fever and chills.  Patient reports that symptoms have been going on over a week now, was seen and evaluated in urgent care, referred to ED for marked leukocytosis   Patient Denies having: Reports of subjective fever, Chills, productive cough, SOB, but denies chest Pain, Abd pain, N/V/D,  headache, dizziness, lightheadedness,  Dysuria, Joint pain, rash, open wounds"  Hospital Course:  Summary of her active problems in the hospital is as following.   1.  Sepsis POA due to Streptococcus pneumonia and bacteremia. Follow-up culture so far negative. Treated with IV ceftriaxone for total of 6 days. Transition to oral Augmentin for 7 more days. Patient reported some chest pain, Repeat chest x-ray on 07/03/2019 shows no evidence of pleural effusion or any other acute abnormality. Continue scheduled naproxen. Covid negative influenza negative.  patient actually had left lobar pneumonia in 2019 as well. Appears to have unilateral expiratory wheezes at the time of my evaluation. Patient will benefit from outpatient CT chest after completion of the antibiotics in 4 to 6 weeks to ensure resolution.  2.  History of arthralgia and osteoarthritis. Continue home regimen of Ultram.  3.  Depression.  Anxiety.  Previously. Continue home regimen.  4.  Chronic smoker patient Nicotine patch.  5.  Hypokalemia. Corrected.  6.  Anemia likely from chronic nutritional deficiency. Monitor.  Outpatient work-up with PCP.  7.  Hyponatremia. Currently sodium improving and normal.  Monitor.  8.  Acute kidney injury. On presentation serum creatinine 1.24. Currently serum creatinine 0.66. Monitor.  Patient  9.  Metabolic acidosis. Likely in the setting of AKI.  Monitor.  10 constipation. As needed Colace  11.  Essential hypertension. In the setting of presentation with acute kidney injury as well as hyponatremia currently holding HCTZ and lisinopril. Adding Lopressor 12.5 mg twice daily.  Follow-up with PCP for further adjustment.  Patient was seen by physical therapy, who recommended Home health, which was arranged.  On the day of the discharge the patient's vitals were stable, and no other acute medical condition were reported by patient. the patient was felt safe to be  discharge at Home with Home health.  Consultants: none Procedures: none  Discharge Exam: General: Appear in mild distress, no Rash; Oral Mucosa Clear, moist. Cardiovascular: S1 and S2 Present, no Murmur, Respiratory: normal respiratory effort, Bilateral Air entry present and left Crackles, left wheezes Abdomen: Bowel Sound present, Soft and no tenderness, no hernia Extremities: no Pedal edema, no calf tenderness Neurology: alert and oriented to time, place, and person affect appropriate.  Filed Weights   07/02/19 0448 07/03/19 0421 07/04/19 0500  Weight: 62.8 kg 61.1 kg 58.2 kg   Vitals:   07/04/19 0714 07/04/19 1126  BP: 117/85 (!) 119/91  Pulse: (!) 114 (!) 114  Resp: 18 19  Temp: (!) 97.2 F (36.2 C) 99.3 F (37.4 C)  SpO2: 98% 99%    DISCHARGE MEDICATION: Allergies as of 07/04/2019      Reactions   Sulfa Antibiotics Hives      Medication List    STOP taking these medications   hydrochlorothiazide 25 MG tablet Commonly known as: HYDRODIURIL   lisinopril 20 MG tablet Commonly known as: ZESTRIL     TAKE these medications   ALPRAZolam 0.5 MG tablet Commonly known as: XANAX Take 0.5 mg by mouth 2 (two) times daily as needed for anxiety.   amoxicillin-clavulanate 875-125 MG tablet Commonly known as: Augmentin Take 1 tablet by mouth 2 (two) times daily for 14 days.   calcium carbonate 600 MG Tabs tablet Commonly known as: Calcium 600 Take 2 tablets (1,200 mg total) by mouth daily with breakfast.   cyclobenzaprine 10 MG tablet Commonly known as: FLEXERIL Take 1 tablet (10 mg total) by mouth 3 (three) times daily.   docusate sodium 100 MG capsule Commonly known as: COLACE Take 1 capsule (100 mg total) by mouth 2 (two) times daily as needed for mild constipation.   ergocalciferol 1.25 MG (50000 UT) capsule Commonly known as: VITAMIN D2 Take 1 capsule (50,000 Units total) by mouth 2 (two) times a week. X 6 weeks.   Magnesium 500 MG Caps Take 1 capsule  (500 mg total) by mouth daily with breakfast.   metoprolol tartrate 25 MG tablet Commonly known as: LOPRESSOR Take 0.5 tablets (12.5 mg total) by mouth 2 (two) times daily.   naproxen 500 MG EC tablet Commonly known as: EC NAPROSYN Take 1 tablet (500 mg total) by mouth 2 (two) times daily with a meal for 6 days.   sertraline 25 MG tablet Commonly known as: ZOLOFT Take 25 mg by mouth daily.   traMADol 50 MG tablet Commonly known as: ULTRAM Take 2 tablets (100 mg total) by mouth 3 (three) times daily. Must last 30 days   Vitamin D3 125 MCG (5000 UT) Caps Take 1 capsule (5,000 Units total) by mouth daily with breakfast. Take along with calcium and magnesium.      Allergies  Allergen Reactions  . Sulfa Antibiotics Hives   Discharge Instructions    Diet - low sodium heart healthy   Complete by: As directed    Discharge instructions   Complete by: As directed    It is important that you read the given instructions as well as go over your medication list with RN to help you understand your care after this hospitalization.  Please follow-up with PCP in 1-2 weeks.  Please note that NO REFILLS for any discharge  medications will be authorized once you are discharged, as it is imperative that you return to your primary care physician (or establish a relationship with a primary care physician if you do not have one) for your aftercare needs so that they can reassess your need for medications and monitor your lab values.  Please request your primary care physician to go over all Hospital Tests and Procedure/Radiological results at the follow up. Please get all Hospital records sent to your PCP by signing hospital release before you go home.   Do not take more than prescribed Pain, Sleep and Anxiety Medications.  You were cared for by a hospitalist during your hospital stay. If you have any questions about your discharge medications or the care you received while you were in the hospital  after you are discharged, you can call the unit '@UNIT' @ you were admitted to and ask to speak with the hospitalist Berle Mull. Ask for Hospitalist on call if the hospitalist that took care of you is not available.   Once you are discharged, your primary care physician will handle any further medical issues.  You Must read complete instructions/literature along with all the possible adverse reactions/side effects for all the Medicines you take and that have been prescribed to you. Take any new Medicines after you have completely understood and accept all the possible adverse reactions/side effects.  If you have smoked or chewed Tobacco in the last 2 yrs please STOP smoking If you drink alcohol, please safely STOP the use. Do not drive, operating heavy machinery, perform activities at heights, swimming or participation in water activities or provide baby sitting services under influence.  Wear Seat belts while driving.   Increase activity slowly   Complete by: As directed       The results of significant diagnostics from this hospitalization (including imaging, microbiology, ancillary and laboratory) are listed below for reference.    Significant Diagnostic Studies: DG Chest 2 View  Result Date: 06/29/2019 CLINICAL DATA:  Cough and fever EXAM: CHEST - 2 VIEW COMPARISON:  December 04, 2017 FINDINGS: There is airspace consolidation in the apical segment of the left upper lobe. The lungs elsewhere are clear. The heart size and pulmonary vascularity are normal. No adenopathy. No bone lesions. IMPRESSION: Airspace consolidation most likely representing pneumonia in the apical segment of the left upper lobe. Lungs elsewhere clear. Heart size normal. No adenopathy. Followup PA and lateral chest radiographs recommended in 3-4 weeks following trial of antibiotic therapy to ensure resolution and exclude underlying malignancy. These results will be called to the ordering clinician or representative by the  Radiologist Assistant, and communication documented in the PACS or [employer]. Electronically Signed   By: Lowella Grip III M.D.   On: 06/29/2019 19:09   DG Chest Port 1 View  Result Date: 07/03/2019 CLINICAL DATA:  Left chest pain EXAM: PORTABLE CHEST 1 VIEW COMPARISON:  June 29, 2019 FINDINGS: The heart size and mediastinal contours are within normal limits. Persistent patchy opacity of the medial left apex is noted. Lungs are hyperinflated the visualized skeletal structures are unremarkable. IMPRESSION: Persistent patchy opacity of the medial left apex. Electronically Signed   By: Abelardo Diesel M.D.   On: 07/03/2019 13:49    Microbiology: Recent Results (from the past 240 hour(s))  SARS Coronavirus 2 Ag (30 min TAT) - Nasal Swab (BD Veritor Kit)     Status: None   Collection Time: 06/29/19  7:10 PM   Specimen: Nasal Swab (BD Veritor Kit)  Result Value Ref Range Status   SARS Coronavirus 2 Ag NEGATIVE NEGATIVE Final    Comment: (NOTE) SARS-CoV-2 antigen NOT DETECTED.  Negative results are presumptive.  Negative results do not preclude SARS-CoV-2 infection and should not be used as the sole basis for treatment or other patient management decisions, including infection  control decisions, particularly in the presence of clinical signs and  symptoms consistent with COVID-19, or in those who have been in contact with the virus.  Negative results must be combined with clinical observations, patient history, and epidemiological information. The expected result is Negative. Fact Sheet for Patients: PodPark.tn Fact Sheet for Healthcare Providers: GiftContent.is This test is not yet approved or cleared by the [place] FDA and  has been authorized for detection and/or diagnosis of SARS-CoV-2 by FDA under an Emergency Use Authorization (EUA).  This EUA will remain in effect (meaning this test can be used) for the duration  of  the COVID-19 de claration under Section 564(b)(1) of the Act, 21 U.S.C. section 360bbb-3(b)(1), unless the authorization is terminated or revoked sooner. Performed at [facility], [address], [place], [place] [address]   Blood Culture (routine x 2)     Status: None (Preliminary result)   Collection Time: 06/29/19  8:39 PM   Specimen: BLOOD  Result Value Ref Range Status   Specimen Description BLOOD LEFT AC  Final   Special Requests   Final    BOTTLES DRAWN AEROBIC AND ANAEROBIC Blood Culture results may not be optimal due to an excessive volume of blood received in culture bottles   Culture   Final    NO GROWTH 4 DAYS Performed at [facility], [address], [place], [place] [address]    Report Status PENDING  Incomplete  Urine culture     Status: None   Collection Time: 06/29/19 10:00 PM   Specimen: In/Out Cath Urine  Result Value Ref Range Status   Specimen Description   Final    IN/OUT CATH URINE Performed at [facility], [address], [place], [place] [address]    Special Requests   Final    NONE Performed at [facility], [address], [place], [place] [address]    Culture   Final    NO GROWTH Performed at [facility], [place] [address], [place], [place] [address]    Report Status 07/01/2019 FINAL  Final  Blood Culture (routine x 2)     Status: Abnormal   Collection Time: 06/29/19 11:51 PM   Specimen: BLOOD  Result Value Ref Range Status   Specimen Description   Final    BLOOD LEFT ANTECUBITAL Performed at [facility], [address] [place], [place] [address]    Special Requests   Final    BOTTLES DRAWN AEROBIC AND ANAEROBIC Blood Culture adequate volume Performed at [facility], [address] [place], [place] [address]    Culture  Setup Time   Final    GRAM POSITIVE COCCI ANAEROBIC BOTTLE ONLY CRITICAL RESULT CALLED TO, READ BACK BY AND VERIFIED WITH: [facility] HALLAJI  06/30/19 @ 1808  [place] Performed at [facility], [address] [place],  [address]    Culture STREPTOCOCCUS PNEUMONIAE (A)  Final   Report Status 07/02/2019 FINAL  Final   Organism ID, Bacteria STREPTOCOCCUS PNEUMONIAE  Final      Susceptibility   Streptococcus pneumoniae - MIC*    ERYTHROMYCIN >=8 RESISTANT Resistant  LEVOFLOXACIN 1 SENSITIVE Sensitive     VANCOMYCIN 0.5 SENSITIVE Sensitive     PENICILLIN (meningitis) <=0.06 SENSITIVE Sensitive     PENO - penicillin <=0.06      PENICILLIN (non-meningitis) <=0.06 SENSITIVE Sensitive     PENICILLIN (oral) <=0.06 SENSITIVE Sensitive     CEFTRIAXONE (non-meningitis) <=0.12 SENSITIVE Sensitive     CEFTRIAXONE (meningitis) <=0.12 SENSITIVE Sensitive     * STREPTOCOCCUS PNEUMONIAE  Respiratory Panel by RT PCR (Flu A&B, Covid) - Nasopharyngeal Swab     Status: None   Collection Time: 06/30/19  2:57 AM   Specimen: Nasopharyngeal Swab  Result Value Ref Range Status   SARS Coronavirus 2 by RT PCR NEGATIVE NEGATIVE Final    Comment: (NOTE) SARS-CoV-2 target nucleic acids are NOT DETECTED. The SARS-CoV-2 RNA is generally detectable in upper respiratoy specimens during the acute phase of infection. The lowest concentration of SARS-CoV-2 viral copies this assay can detect is 131 copies/mL. A negative result does not preclude SARS-Cov-2 infection and should not be used as the sole basis for treatment or other patient management decisions. A negative result may occur with  improper specimen collection/handling, submission of specimen other than nasopharyngeal swab, presence of viral mutation(s) within the areas targeted by this assay, and inadequate number of viral copies (<131 copies/mL). A negative result must be combined with clinical observations, patient history, and epidemiological information. The expected result is Negative. Fact Sheet for Patients:  PinkCheek.be Fact Sheet for  Healthcare Providers:  GravelBags.it This test is not yet ap proved or cleared by the [place] FDA and  has been authorized for detection and/or diagnosis of SARS-CoV-2 by FDA under an Emergency Use Authorization (EUA). This EUA will remain  in effect (meaning this test can be used) for the duration of the COVID-19 declaration under Section 564(b)(1) of the Act, 21 U.S.C. section 360bbb-3(b)(1), unless the authorization is terminated or revoked sooner.    Influenza A by PCR NEGATIVE NEGATIVE Final   Influenza B by PCR NEGATIVE NEGATIVE Final    Comment: (NOTE) The Xpert Xpress SARS-CoV-2/FLU/RSV assay is intended as an aid in  the diagnosis of influenza from Nasopharyngeal swab specimens and  should not be used as a sole basis for treatment. Nasal washings and  aspirates are unacceptable for Xpert Xpress SARS-CoV-2/FLU/RSV  testing. Fact Sheet for Patients: PinkCheek.be Fact Sheet for Healthcare Providers: GravelBags.it This test is not yet approved or cleared by the [place] FDA and  has been authorized for detection and/or diagnosis of SARS-CoV-2 by  FDA under an Emergency Use Authorization (EUA). This EUA will remain  in effect (meaning this test can be used) for the duration of the  Covid-19 declaration under Section 564(b)(1) of the Act, 21  U.S.C. section 360bbb-3(b)(1), unless the authorization is  terminated or revoked. Performed at [facility], [address] [place], [place] [address]   MRSA PCR Screening     Status: None   Collection Time: 07/01/19  5:04 AM   Specimen: Nasal Mucosa; Nasopharyngeal  Result Value Ref Range Status   MRSA by PCR NEGATIVE NEGATIVE Final    Comment:        The GeneXpert MRSA Assay (FDA approved for NASAL specimens only), is one component of a comprehensive MRSA colonization surveillance program. It is not intended to diagnose  MRSA infection nor to guide or monitor treatment for MRSA infections. Performed at [facility], [address], [place], [place] [address]   Culture, sputum-assessment  Status: None   Collection Time: 07/01/19  5:05 AM   Specimen: Expectorated Sputum  Result Value Ref Range Status   Specimen Description EXPECTORATED SPUTUM  Final   Special Requests NONE  Final   Sputum evaluation   Final    THIS SPECIMEN IS ACCEPTABLE FOR SPUTUM CULTURE Performed at [facility], [address], [place], [place] [address]    Report Status 07/01/2019 FINAL  Final  Culture, respiratory     Status: None (Preliminary result)   Collection Time: 07/01/19  5:05 AM  Result Value Ref Range Status   Specimen Description   Final    EXPECTORATED SPUTUM Performed at [facility], [address] [place], [place] [address]    Special Requests   Final    NONE Reflexed from [id number] Performed at [facility], [address] [place], [place] [address]    Gram Stain   Final    MODERATE WBC PRESENT,BOTH PMN AND MONONUCLEAR RARE GRAM VARIABLE ROD MODERATE YEAST    Culture   Final    CULTURE REINCUBATED FOR BETTER GROWTH Performed at [facility], [place] [address] [place], [place] [address]    Report Status PENDING  Incomplete  CULTURE, BLOOD (ROUTINE X 2) w Reflex to ID Panel     Status: None (Preliminary result)   Collection Time: 07/02/19 12:48 PM   Specimen: BLOOD RIGHT HAND  Result Value Ref Range Status   Specimen Description BLOOD RIGHT HAND  Final   Special Requests   Final    BOTTLES DRAWN AEROBIC AND ANAEROBIC Blood Culture adequate volume   Culture   Final    NO GROWTH 2 DAYS Performed at [facility], [address] [place], [place] [address]    Report Status PENDING  Incomplete  CULTURE, BLOOD (ROUTINE X 2) w Reflex to ID Panel     Status: None (Preliminary result)   Collection Time: 07/02/19 12:54 PM   Specimen: BLOOD RIGHT ARM   Result Value Ref Range Status   Specimen Description BLOOD RIGHT ARM  Final   Special Requests   Final    BOTTLES DRAWN AEROBIC AND ANAEROBIC Blood Culture adequate volume   Culture   Final    NO GROWTH 2 DAYS Performed at [facility], [address] [place], [place] [address]    Report Status PENDING  Incomplete     Labs: CBC: Recent Labs  Lab 06/29/19 1922 07/01/19 0915 07/02/19 0636 07/03/19 0544 07/04/19 0539  WBC 29.1* 13.1* 10.8* 10.9* 14.4*  NEUTROABS 25.0* 9.9*  --   --   --   HGB 11.1* 10.3* 9.2* 9.7* 11.6*  HCT 31.5* 31.1* 27.4* 28.3* 32.9*  MCV 87.3 91.7 90.4 88.7 86.4  PLT 296 313 302 347 725*   Basic Metabolic Panel: Recent Labs  Lab 06/29/19 1922 06/29/19 1922 06/30/19 0802 07/01/19 0452 07/02/19 0636 07/03/19 0544 07/04/19 0539  NA 126*  --   --  137 137 138 136  K 3.8  --   --  3.6 3.8 3.3* 3.3*  CL 95*  --   --  110 106 101 97*  CO2 17*  --   --  '23 24 27 25  ' GLUCOSE 127*  --   --  86 97 101* 120*  BUN 25*  --   --  '16 11 8 8  ' CREATININE 1.24*   < > 1.05* 0.82 0.76 0.66 0.51  CALCIUM 8.5*   < > 7.5* 7.6* 8.4* 8.6*  9.0  MG  --   --  2.0  --   --   --   --   PHOS  --   --  2.7  --   --   --   --    < > = values in this interval not displayed.   Liver Function Tests: No results for input(s): AST, ALT, ALKPHOS, BILITOT, PROT, ALBUMIN in the last 168 hours. No results for input(s): LIPASE, AMYLASE in the last 168 hours. No results for input(s): AMMONIA in the last 168 hours. Cardiac Enzymes: No results for input(s): CKTOTAL, CKMB, CKMBINDEX, TROPONINI in the last 168 hours. BNP (last 3 results) Recent Labs    06/30/19 0802  BNP 241.0*   CBG: Recent Labs  Lab 07/02/19 0816 07/03/19 0806 07/03/19 2039 07/04/19 0712 07/04/19 1126  GLUCAP 86 94 110* 111* 122*    Time spent: 35 minutes  Signed:  Berle Mull  [facility]  07/04/2019 11:59 AM

## 2019-07-04 NOTE — Progress Notes (Signed)
Patient discharged to home. Tele and IV d/c'd.  Verbalizes understanding of discharge instructions. 

## 2019-07-04 NOTE — TOC Transition Note (Signed)
Transition of Care [facility]) - CM/SW Discharge Note   Patient Details  Name: Ellen Fleming MRN: [id number] Date of Birth: November 29, 1950  [facility] CM/SW Contact:  Maree Krabbe, LCSW Phone Number: 07/04/2019, 10:52 AM   Clinical Narrative:   Home Health has been arranged. No additional needs noted at this time.    Final next level of care: Home w Home Health Services Barriers to Discharge: No Barriers Identified   Patient Goals and CMS Choice        Discharge Placement                       Discharge Plan and Services                  DME Agency: NA       HH Arranged: PT, RN HH Agency: [facility] Date HH Agency Contacted: 07/04/19 Time HH Agency Contacted: 1052 Representative spoke with at [facility] Agency: Barbara Cower  Social Determinants of Health (SDOH) Interventions     Readmission Risk Interventions No flowsheet data found.

## 2019-07-05 LAB — CULTURE, BLOOD (ROUTINE X 2): Culture: NO GROWTH

## 2019-07-07 LAB — CULTURE, BLOOD (ROUTINE X 2)
Culture: NO GROWTH
Culture: NO GROWTH
Special Requests: ADEQUATE
Special Requests: ADEQUATE

## 2019-07-16 ENCOUNTER — Other Ambulatory Visit: Payer: Self-pay | Admitting: Internal Medicine

## 2019-07-16 DIAGNOSIS — R1310 Dysphagia, unspecified: Secondary | ICD-10-CM

## 2019-07-19 ENCOUNTER — Telehealth: Payer: Self-pay | Admitting: Emergency Medicine

## 2019-07-28 ENCOUNTER — Ambulatory Visit
Admission: RE | Admit: 2019-07-28 | Discharge: 2019-07-28 | Disposition: A | Discharge status: Home / Self Care | Payer: Medicare Other | Source: Ambulatory Visit | Attending: Internal Medicine | Admitting: Internal Medicine

## 2019-07-28 ENCOUNTER — Other Ambulatory Visit: Payer: Self-pay

## 2019-07-28 DIAGNOSIS — R1310 Dysphagia, unspecified: Secondary | ICD-10-CM | POA: Diagnosis present
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
## 2019-10-28 ENCOUNTER — Other Ambulatory Visit: Payer: Self-pay | Admitting: Internal Medicine

## 2019-10-28 DIAGNOSIS — Z1231 Encounter for screening mammogram for malignant neoplasm of breast: Secondary | ICD-10-CM

## 2019-11-30 ENCOUNTER — Telehealth: Payer: Self-pay | Admitting: *Deleted

## 2019-11-30 NOTE — Telephone Encounter (Signed)
Voicemail left with patient re; VV 11/30/2019, to please return the call.

## 2019-12-01 ENCOUNTER — Ambulatory Visit: Payer: Medicare Other | Attending: Pain Medicine | Admitting: Pain Medicine

## 2019-12-01 ENCOUNTER — Encounter: Payer: Self-pay | Admitting: Pain Medicine

## 2019-12-01 ENCOUNTER — Other Ambulatory Visit: Payer: Self-pay

## 2019-12-01 DIAGNOSIS — M7918 Myalgia, other site: Secondary | ICD-10-CM

## 2019-12-01 DIAGNOSIS — M810 Age-related osteoporosis without current pathological fracture: Secondary | ICD-10-CM

## 2019-12-01 DIAGNOSIS — F1021 Alcohol dependence, in remission: Secondary | ICD-10-CM | POA: Insufficient documentation

## 2019-12-01 DIAGNOSIS — Z79899 Other long term (current) drug therapy: Secondary | ICD-10-CM

## 2019-12-01 DIAGNOSIS — E559 Vitamin D deficiency, unspecified: Secondary | ICD-10-CM | POA: Diagnosis not present

## 2019-12-01 DIAGNOSIS — G894 Chronic pain syndrome: Secondary | ICD-10-CM | POA: Diagnosis not present

## 2019-12-01 MED ORDER — TRAMADOL HCL 50 MG PO TABS: 100.0000 mg | ORAL_TABLET | Freq: Three times a day (TID) | ORAL | 2 refills | Status: DC

## 2019-12-01 MED ORDER — CYCLOBENZAPRINE HCL 10 MG PO TABS: 10.0000 mg | ORAL_TABLET | Freq: Three times a day (TID) | ORAL | 5 refills | Status: DC
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
## 2019-12-02 ENCOUNTER — Ambulatory Visit
Admission: RE | Admit: 2019-12-02 | Discharge: 2019-12-02 | Disposition: A | Discharge status: Home / Self Care | Payer: Medicare Other | Source: Ambulatory Visit | Attending: Internal Medicine | Admitting: Internal Medicine

## 2019-12-02 DIAGNOSIS — Z1231 Encounter for screening mammogram for malignant neoplasm of breast: Secondary | ICD-10-CM | POA: Insufficient documentation

## 2019-12-12 ENCOUNTER — Other Ambulatory Visit: Payer: Self-pay | Admitting: Pain Medicine

## 2019-12-12 DIAGNOSIS — M7918 Myalgia, other site: Secondary | ICD-10-CM

## 2020-02-28 ENCOUNTER — Other Ambulatory Visit: Payer: Self-pay | Admitting: Pain Medicine

## 2020-02-28 ENCOUNTER — Encounter: Payer: Medicare Other | Admitting: Pain Medicine

## 2020-02-28 DIAGNOSIS — M7918 Myalgia, other site: Secondary | ICD-10-CM

## 2020-02-28 DIAGNOSIS — M810 Age-related osteoporosis without current pathological fracture: Secondary | ICD-10-CM

## 2020-02-28 DIAGNOSIS — E559 Vitamin D deficiency, unspecified: Secondary | ICD-10-CM

## 2020-02-28 MED ORDER — CALCIUM CARBONATE 600 MG PO TABS: 1200.0000 mg | ORAL_TABLET | Freq: Every day | ORAL | 2 refills | Status: DC

## 2020-02-28 MED ORDER — MAGNESIUM 500 MG PO CAPS: 500.0000 mg | ORAL_CAPSULE | Freq: Every day | ORAL | 2 refills | Status: DC

## 2020-02-28 MED ORDER — VITAMIN D3 125 MCG (5000 UT) PO CAPS: 1.0000 | ORAL_CAPSULE | Freq: Every day | ORAL | 2 refills | Status: DC

## 2020-02-28 MED ORDER — CYCLOBENZAPRINE HCL 10 MG PO TABS: 10.0000 mg | ORAL_TABLET | Freq: Three times a day (TID) | ORAL | 2 refills | Status: DC

## 2020-02-28 NOTE — Progress Notes (Deleted)
No show

## 2020-07-05 ENCOUNTER — Other Ambulatory Visit: Payer: Self-pay | Admitting: Pain Medicine

## 2020-07-05 DIAGNOSIS — M7918 Myalgia, other site: Secondary | ICD-10-CM

## 2020-07-18 ENCOUNTER — Other Ambulatory Visit: Payer: Self-pay

## 2020-07-18 ENCOUNTER — Encounter: Payer: Self-pay | Admitting: Ophthalmology

## 2020-07-24 NOTE — Discharge Instructions (Signed)

## 2020-07-25 ENCOUNTER — Other Ambulatory Visit: Payer: Self-pay

## 2020-07-25 ENCOUNTER — Ambulatory Visit: Payer: Medicare Other | Admitting: Anesthesiology

## 2020-07-25 ENCOUNTER — Encounter: Payer: Self-pay | Admitting: Ophthalmology

## 2020-07-25 ENCOUNTER — Ambulatory Visit
Admission: RE | Admit: 2020-07-25 | Discharge: 2020-07-25 | Disposition: A | Discharge status: Home / Self Care | Payer: Medicare Other | Attending: Ophthalmology | Admitting: Ophthalmology

## 2020-07-25 ENCOUNTER — Encounter
Admission: RE | Disposition: A | Discharge status: Home / Self Care | Payer: Self-pay | Source: Home / Self Care | Attending: Ophthalmology

## 2020-07-25 DIAGNOSIS — Z79899 Other long term (current) drug therapy: Secondary | ICD-10-CM | POA: Insufficient documentation

## 2020-07-25 DIAGNOSIS — F17210 Nicotine dependence, cigarettes, uncomplicated: Secondary | ICD-10-CM | POA: Insufficient documentation

## 2020-07-25 DIAGNOSIS — H2512 Age-related nuclear cataract, left eye: Secondary | ICD-10-CM | POA: Diagnosis present

## 2020-07-25 DIAGNOSIS — Z882 Allergy status to sulfonamides status: Secondary | ICD-10-CM | POA: Diagnosis not present

## 2020-07-25 HISTORY — PX: CATARACT EXTRACTION W/PHACO: SHX586

## 2020-07-25 HISTORY — DX: Presence of dental prosthetic device (complete) (partial): Z97.2

## 2020-07-25 SURGERY — PHACOEMULSIFICATION, CATARACT, WITH IOL INSERTION
Anesthesia: Monitor Anesthesia Care | Site: Eye | Laterality: Left

## 2020-07-25 MED ORDER — TETRACAINE HCL 0.5 % OP SOLN
1.0000 [drp] | OPHTHALMIC | Status: DC | PRN
  Administered 2020-07-25 (×3): 1 [drp] via OPHTHALMIC

## 2020-07-25 MED ORDER — FENTANYL CITRATE (PF) 100 MCG/2ML IJ SOLN
INTRAMUSCULAR | Status: DC | PRN
  Administered 2020-07-25: 50 ug via INTRAVENOUS

## 2020-07-25 MED ORDER — BRIMONIDINE TARTRATE-TIMOLOL 0.2-0.5 % OP SOLN
OPHTHALMIC | Status: DC | PRN
  Administered 2020-07-25: 1 [drp] via OPHTHALMIC

## 2020-07-25 MED ORDER — ACETAMINOPHEN 160 MG/5ML PO SOLN: 325.0000 mg | ORAL | Status: DC | PRN

## 2020-07-25 MED ORDER — ONDANSETRON HCL 4 MG/2ML IJ SOLN: 4.0000 mg | Freq: Once | INTRAMUSCULAR | Status: DC | PRN

## 2020-07-25 MED ORDER — NA CHONDROIT SULF-NA HYALURON 40-17 MG/ML IO SOLN
INTRAOCULAR | Status: DC | PRN
  Administered 2020-07-25: 1 mL via INTRAOCULAR

## 2020-07-25 MED ORDER — LIDOCAINE HCL (PF) 2 % IJ SOLN
INTRAOCULAR | Status: DC | PRN
  Administered 2020-07-25: 1 mL

## 2020-07-25 MED ORDER — ARMC OPHTHALMIC DILATING DROPS
1.0000 "application " | OPHTHALMIC | Status: DC | PRN
  Administered 2020-07-25 (×3): 1 via OPHTHALMIC

## 2020-07-25 MED ORDER — EPINEPHRINE PF 1 MG/ML IJ SOLN
INTRAOCULAR | Status: DC | PRN
  Administered 2020-07-25: 66 mL via OPHTHALMIC

## 2020-07-25 MED ORDER — MIDAZOLAM HCL 2 MG/2ML IJ SOLN
INTRAMUSCULAR | Status: DC | PRN
  Administered 2020-07-25: 1 mg via INTRAVENOUS

## 2020-07-25 MED ORDER — MOXIFLOXACIN HCL 0.5 % OP SOLN
OPHTHALMIC | Status: DC | PRN
  Administered 2020-07-25: 0.2 mL via OPHTHALMIC

## 2020-07-25 MED ORDER — ACETAMINOPHEN 325 MG PO TABS: 325.0000 mg | ORAL_TABLET | ORAL | Status: DC | PRN

## 2020-07-25 SURGICAL SUPPLY — 17 items
CANNULA ANT/CHMB 27GA (MISCELLANEOUS) ×4 IMPLANT
GLOVE SURG TRIUMPH 8.0 PF LTX (GLOVE) ×6 IMPLANT
GOWN STRL REUS W/ TWL LRG LVL3 (GOWN DISPOSABLE) ×2 IMPLANT
GOWN STRL REUS W/TWL LRG LVL3 (GOWN DISPOSABLE) ×4
LENS IOL TECNIS EYHANCE 20.5 (Intraocular Lens) ×2 IMPLANT
MARKER SKIN DUAL TIP RULER LAB (MISCELLANEOUS) ×2 IMPLANT
NEEDLE FILTER BLUNT 18X 1/2SAF (NEEDLE) ×1
NEEDLE FILTER BLUNT 18X1 1/2 (NEEDLE) ×1 IMPLANT
PACK EYE AFTER SURG (MISCELLANEOUS) ×2 IMPLANT
PACK OPTHALMIC (MISCELLANEOUS) ×2 IMPLANT
PACK PORFILIO (MISCELLANEOUS) ×2 IMPLANT
SUT ETHILON 10-0 CS-B-6CS-B-6 (SUTURE)
SUTURE EHLN 10-0 CS-B-6CS-B-6 (SUTURE) IMPLANT
SYR 3ML LL SCALE MARK (SYRINGE) ×2 IMPLANT
SYR TB 1ML LUER SLIP (SYRINGE) ×2 IMPLANT
WATER STERILE IRR 250ML POUR (IV SOLUTION) ×2 IMPLANT
WIPE NON LINTING 3.25X3.25 (MISCELLANEOUS) ×2 IMPLANT

## 2020-07-25 NOTE — Anesthesia Postprocedure Evaluation (Signed)
Anesthesia Post Note  Patient: Ellen Fleming  Procedure(s) Performed: CATARACT EXTRACTION PHACO AND INTRAOCULAR LENS PLACEMENT (IOC) LEFT (Left Eye)     Patient location during evaluation: PACU Anesthesia Type: MAC Level of consciousness: awake Pain management: pain level controlled Vital Signs Assessment: post-procedure vital signs reviewed and stable Respiratory status: respiratory function stable Cardiovascular status: stable Postop Assessment: no apparent nausea or vomiting Anesthetic complications: no   No complications documented.  Veda Canning

## 2020-07-25 NOTE — Anesthesia Preprocedure Evaluation (Addendum)
Anesthesia Evaluation  Patient identified by MRN, date of birth, ID band Patient awake    Reviewed: Allergy & Precautions, NPO status   Airway Mallampati: II  TM Distance: >3 FB     Dental   Pulmonary Current Smoker and Patient abstained from smoking.,  Chronic cough from smoking   Pulmonary exam normal        Cardiovascular hypertension,  Rhythm:Regular Rate:Normal     Neuro/Psych PSYCHIATRIC DISORDERS Anxiety Depression PTSD   GI/Hepatic   Endo/Other    Renal/GU      Musculoskeletal  (+) Arthritis ,   Abdominal   Peds  Hematology   Anesthesia Other Findings Problem list: Chronic pain syndrome Long term current use of opiate analgesic Long term prescription opiate use Opiate use (30 MME/Day) Encounter for therapeutic drug level monitoring Alcohol dependence (HCC) Closed fracture of part of fibula with tibia Closed fracture pubis (HCC) Cystocele, midline H/O cerebral parenchymal hemorrhage Hyperlipidemia Hypertension Mixed stress and urge urinary incontinence MVP (mitral valve prolapse) PTSD (post-traumatic stress disorder) Uterine prolapse Subarachnoid hemorrhage (HCC) L2 vertebral fracture (HCC) Opiate dependence (HCC) Chronic low back pain (1ry area of Pain) (R>L) w/o sciatica Lumbar spondylosis Chronic lumbar radicular pain (Right) Chronic hip pain (Right) Myofascial pain syndrome Neck pain History of closed head injury Depression Generalized anxiety disorder Spondylosis of cervical 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 or radiculopathy Nicotine dependence Chronic cervical radicular pain (Left) Compression fracture of L2 lumbar vertebra with routine healing Chronic lower extremity pain (2ry area of Pain) (Right) Nicotine dependence, uncomplicated Major depressive disorder, single episode Wedge compression fracture of second lumbar vertebra with routine healing Osteoporosis,  post-menopausal Osteoarthritis of hip (Right) Elevated C-reactive protein (CRP) Elevated sed rate Musculoskeletal pain Prolapse of female pelvic organs Fracture of multiple pubic rami (HCC) Trauma Vertebral fracture Posttraumatic stress disorder Pharmacologic therapy Disorder of skeletal system Problems influencing health status Vitamin D deficiency Sepsis (HCC) History of alcohol dependence (HCC)  Reproductive/Obstetrics                             Anesthesia Physical  Anesthesia Plan  ASA: III  Anesthesia Plan: MAC   Post-op Pain Management:    Induction: Intravenous  PONV Risk Score and Plan: TIVA, Midazolam and Treatment may vary due to age or medical condition  Airway Management Planned: Natural Airway and Nasal Cannula  Additional Equipment:   Intra-op Plan:   Post-operative Plan:   Informed Consent: I have reviewed the patients History and Physical, chart, labs and discussed the procedure including the risks, benefits and alternatives for the proposed anesthesia with the patient or authorized representative who has indicated his/her understanding and acceptance.       Plan Discussed with: CRNA  Anesthesia Plan Comments:         Anesthesia Quick Evaluation  

## 2020-07-25 NOTE — Op Note (Signed)
PREOPERATIVE DIAGNOSIS:  Nuclear sclerotic cataract of the left eye.   POSTOPERATIVE DIAGNOSIS:  Nuclear sclerotic cataract of the left eye.   OPERATIVE PROCEDURE:@   SURGEON:  Galen Manila, MD.   ANESTHESIA:  Anesthesiologist: Jola Babinski, MD CRNA: Michaele Offer, CRNA  1.      Managed anesthesia care. 2.     0.10ml of Shugarcaine was instilled following the paracentesis   COMPLICATIONS:  None.   TECHNIQUE:   Stop and chop   DESCRIPTION OF PROCEDURE:  The patient was examined and consented in the preoperative holding area where the aforementioned topical anesthesia was applied to the left eye and then brought back to the Operating Room where the left eye was prepped and draped in the usual sterile ophthalmic fashion and a lid speculum was placed. A paracentesis was created with the side port blade and the anterior chamber was filled with viscoelastic. A near clear corneal incision was performed with the steel keratome. A continuous curvilinear capsulorrhexis was performed with a cystotome followed by the capsulorrhexis forceps. Hydrodissection and hydrodelineation were carried out with BSS on a blunt cannula. The lens was removed in a stop and chop  technique and the remaining cortical material was removed with the irrigation-aspiration handpiece. The capsular bag was inflated with viscoelastic and the Technis [id number] lens was placed in the capsular bag without complication. The remaining viscoelastic was removed from the eye with the irrigation-aspiration handpiece. The wounds were hydrated. The anterior chamber was flushed with BSS and the eye was inflated to physiologic pressure. 0.87ml Vigamox was placed in the anterior chamber. The wounds were found to be water tight. The eye was dressed with Combigan. The patient was given protective glasses to wear throughout the day and a shield with which to sleep tonight. The patient was also given drops with which to begin a drop regimen today and  will follow-up with me in one day. Implant Name Type Inv. Item Serial No. Manufacturer Lot No. LRB No. Used Action  LENS IOL TECNIS EYHANCE 20.5 - [id number] Intraocular Lens LENS IOL TECNIS EYHANCE 20.5 [id number] JOHNSON   Left 1 Implanted    Procedure(s) with comments: CATARACT EXTRACTION PHACO AND INTRAOCULAR LENS PLACEMENT (IOC) LEFT (Left) - 10.90 0:49.9  Electronically signed: Galen Manila 07/25/2020 8:10 AM

## 2020-07-25 NOTE — Transfer of Care (Signed)
Immediate Anesthesia Transfer of Care Note  Patient: Ellen Fleming  Procedure(s) Performed: CATARACT EXTRACTION PHACO AND INTRAOCULAR LENS PLACEMENT (IOC) LEFT (Left Eye)  Patient Location: PACU  Anesthesia Type: MAC  Level of Consciousness: awake, alert  and patient cooperative  Airway and Oxygen Therapy: Patient Spontanous Breathing and Patient connected to supplemental oxygen  Post-op Assessment: Post-op Vital signs reviewed, Patient's Cardiovascular Status Stable, Respiratory Function Stable, Patent Airway and No signs of Nausea or vomiting  Post-op Vital Signs: Reviewed and stable  Complications: No complications documented.

## 2020-07-25 NOTE — Anesthesia Procedure Notes (Signed)
Procedure Name: MAC Date/Time: 07/25/2020 7:57 AM Performed by: Silvana Newness, CRNA Pre-anesthesia Checklist: Patient identified, Emergency Drugs available, Suction available, Patient being monitored and Timeout performed Patient Re-evaluated:Patient Re-evaluated prior to induction Oxygen Delivery Method: Nasal cannula Placement Confirmation: positive ETCO2
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
## 2020-07-26 ENCOUNTER — Encounter: Payer: Self-pay | Admitting: Ophthalmology

## 2020-07-27 ENCOUNTER — Encounter: Payer: Self-pay | Admitting: Ophthalmology

## 2020-08-04 NOTE — Discharge Instructions (Signed)

## 2020-08-08 ENCOUNTER — Ambulatory Visit: Payer: Medicare Other | Admitting: Anesthesiology

## 2020-08-08 ENCOUNTER — Other Ambulatory Visit: Payer: Self-pay

## 2020-08-08 ENCOUNTER — Ambulatory Visit
Admission: RE | Admit: 2020-08-08 | Discharge: 2020-08-08 | Disposition: A | Discharge status: Home / Self Care | Payer: Medicare Other | Attending: Ophthalmology | Admitting: Ophthalmology

## 2020-08-08 ENCOUNTER — Encounter
Admission: RE | Disposition: A | Discharge status: Home / Self Care | Payer: Self-pay | Source: Home / Self Care | Attending: Ophthalmology

## 2020-08-08 ENCOUNTER — Encounter: Payer: Self-pay | Admitting: Ophthalmology

## 2020-08-08 DIAGNOSIS — H2511 Age-related nuclear cataract, right eye: Secondary | ICD-10-CM | POA: Insufficient documentation

## 2020-08-08 DIAGNOSIS — Z882 Allergy status to sulfonamides status: Secondary | ICD-10-CM | POA: Diagnosis not present

## 2020-08-08 DIAGNOSIS — F17210 Nicotine dependence, cigarettes, uncomplicated: Secondary | ICD-10-CM | POA: Diagnosis not present

## 2020-08-08 DIAGNOSIS — Z79899 Other long term (current) drug therapy: Secondary | ICD-10-CM | POA: Insufficient documentation

## 2020-08-08 HISTORY — PX: CATARACT EXTRACTION W/PHACO: SHX586

## 2020-08-08 SURGERY — PHACOEMULSIFICATION, CATARACT, WITH IOL INSERTION
Anesthesia: Monitor Anesthesia Care | Laterality: Right

## 2020-08-08 MED ORDER — ACETAMINOPHEN 160 MG/5ML PO SOLN: 325.0000 mg | ORAL | Status: DC | PRN

## 2020-08-08 MED ORDER — MIDAZOLAM HCL 2 MG/2ML IJ SOLN
INTRAMUSCULAR | Status: DC | PRN
  Administered 2020-08-08: 1 mg via INTRAVENOUS

## 2020-08-08 MED ORDER — NA CHONDROIT SULF-NA HYALURON 40-17 MG/ML IO SOLN
INTRAOCULAR | Status: DC | PRN
  Administered 2020-08-08: 1 mL via INTRAOCULAR

## 2020-08-08 MED ORDER — ONDANSETRON HCL 4 MG/2ML IJ SOLN
4.0000 mg | Freq: Once | INTRAMUSCULAR | Status: DC | PRN
Start: 2020-08-08 — End: 2020-08-08

## 2020-08-08 MED ORDER — LIDOCAINE HCL (PF) 2 % IJ SOLN
INTRAOCULAR | Status: DC | PRN
  Administered 2020-08-08: 1 mL

## 2020-08-08 MED ORDER — ACETAMINOPHEN 325 MG PO TABS: 325.0000 mg | ORAL_TABLET | ORAL | Status: DC | PRN

## 2020-08-08 MED ORDER — BRIMONIDINE TARTRATE-TIMOLOL 0.2-0.5 % OP SOLN
OPHTHALMIC | Status: DC | PRN
  Administered 2020-08-08: 1 [drp] via OPHTHALMIC

## 2020-08-08 MED ORDER — FENTANYL CITRATE (PF) 100 MCG/2ML IJ SOLN
INTRAMUSCULAR | Status: DC | PRN
  Administered 2020-08-08: 50 ug via INTRAVENOUS

## 2020-08-08 MED ORDER — TETRACAINE HCL 0.5 % OP SOLN
1.0000 [drp] | OPHTHALMIC | Status: DC | PRN
  Administered 2020-08-08 (×3): 1 [drp] via OPHTHALMIC

## 2020-08-08 MED ORDER — MOXIFLOXACIN HCL 0.5 % OP SOLN
OPHTHALMIC | Status: DC | PRN
  Administered 2020-08-08: 0.2 mL via OPHTHALMIC

## 2020-08-08 MED ORDER — ARMC OPHTHALMIC DILATING DROPS
1.0000 "application " | OPHTHALMIC | Status: DC | PRN
  Administered 2020-08-08 (×3): 1 via OPHTHALMIC

## 2020-08-08 MED ORDER — EPINEPHRINE PF 1 MG/ML IJ SOLN
INTRAOCULAR | Status: DC | PRN
  Administered 2020-08-08: 54 mL via OPHTHALMIC

## 2020-08-08 SURGICAL SUPPLY — 15 items
CANNULA ANT/CHMB 27GA (MISCELLANEOUS) ×4 IMPLANT
GLOVE SURG TRIUMPH 8.0 PF LTX (GLOVE) ×2 IMPLANT
GOWN STRL REUS W/ TWL LRG LVL3 (GOWN DISPOSABLE) ×2 IMPLANT
GOWN STRL REUS W/TWL LRG LVL3 (GOWN DISPOSABLE) ×4
LENS IOL TECNIS EYHANCE 20.5 (Intraocular Lens) ×2 IMPLANT
MARKER SKIN DUAL TIP RULER LAB (MISCELLANEOUS) ×2 IMPLANT
NEEDLE FILTER BLUNT 18X 1/2SAF (NEEDLE) ×1
NEEDLE FILTER BLUNT 18X1 1/2 (NEEDLE) ×1 IMPLANT
PACK EYE AFTER SURG (MISCELLANEOUS) ×2 IMPLANT
PACK OPTHALMIC (MISCELLANEOUS) ×2 IMPLANT
PACK PORFILIO (MISCELLANEOUS) ×2 IMPLANT
SYR 3ML LL SCALE MARK (SYRINGE) ×2 IMPLANT
SYR TB 1ML LUER SLIP (SYRINGE) ×2 IMPLANT
WATER STERILE IRR 250ML POUR (IV SOLUTION) ×2 IMPLANT
WIPE NON LINTING 3.25X3.25 (MISCELLANEOUS) ×2 IMPLANT

## 2020-08-08 NOTE — Anesthesia Postprocedure Evaluation (Signed)
Anesthesia Post Note  Patient: Ellen Fleming  Procedure(s) Performed: CATARACT EXTRACTION PHACO AND INTRAOCULAR LENS PLACEMENT (IOC) RIGHT 10.99 00:59.6 (Right )     Patient location during evaluation: PACU Anesthesia Type: MAC Level of consciousness: awake Pain management: pain level controlled Vital Signs Assessment: post-procedure vital signs reviewed and stable Respiratory status: respiratory function stable Cardiovascular status: stable Postop Assessment: no apparent nausea or vomiting Anesthetic complications: no   No complications documented.  Veda Canning

## 2020-08-08 NOTE — Anesthesia Procedure Notes (Signed)
Procedure Name: MAC Date/Time: 08/08/2020 11:04 AM Performed by: Cameron Ali, CRNA Pre-anesthesia Checklist: Patient identified, Emergency Drugs available, Suction available, Timeout performed and Patient being monitored Patient Re-evaluated:Patient Re-evaluated prior to induction Oxygen Delivery Method: Nasal cannula Placement Confirmation: positive ETCO2
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
## 2020-08-08 NOTE — Anesthesia Preprocedure Evaluation (Signed)
Anesthesia Evaluation  Patient identified by MRN, date of birth, ID band Patient awake    Reviewed: Allergy & Precautions, NPO status   Airway Mallampati: II  TM Distance: >3 FB     Dental   Pulmonary Current Smoker and Patient abstained from smoking.,  Chronic cough from smoking   Pulmonary exam normal        Cardiovascular hypertension,  Rhythm:Regular Rate:Normal     Neuro/Psych PSYCHIATRIC DISORDERS Anxiety Depression PTSD   GI/Hepatic   Endo/Other    Renal/GU      Musculoskeletal  (+) Arthritis ,   Abdominal   Peds  Hematology   Anesthesia Other Findings Problem list: Chronic pain syndrome Long term current use of opiate analgesic Long term prescription opiate use Opiate use (30 MME/Day) Encounter for therapeutic drug level monitoring Alcohol dependence (HCC) Closed fracture of part of fibula with tibia Closed fracture pubis (HCC) Cystocele, midline H/O cerebral parenchymal hemorrhage Hyperlipidemia Hypertension Mixed stress and urge urinary incontinence MVP (mitral valve prolapse) PTSD (post-traumatic stress disorder) Uterine prolapse Subarachnoid hemorrhage (HCC) L2 vertebral fracture (HCC) Opiate dependence (HCC) Chronic low back pain (1ry area of Pain) (R>L) w/o sciatica Lumbar spondylosis Chronic lumbar radicular pain (Right) Chronic hip pain (Right) Myofascial pain syndrome Neck pain History of closed head injury Depression Generalized anxiety disorder Spondylosis of cervical 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 or radiculopathy Nicotine dependence Chronic cervical radicular pain (Left) Compression fracture of L2 lumbar vertebra with routine healing Chronic lower extremity pain (2ry area of Pain) (Right) Nicotine dependence, uncomplicated Major depressive disorder, single episode Wedge compression fracture of second lumbar vertebra with routine healing Osteoporosis,  post-menopausal Osteoarthritis of hip (Right) Elevated C-reactive protein (CRP) Elevated sed rate Musculoskeletal pain Prolapse of female pelvic organs Fracture of multiple pubic rami (HCC) Trauma Vertebral fracture Posttraumatic stress disorder Pharmacologic therapy Disorder of skeletal system Problems influencing health status Vitamin D deficiency Sepsis (HCC) History of alcohol dependence (HCC)  Reproductive/Obstetrics                             Anesthesia Physical  Anesthesia Plan  ASA: III  Anesthesia Plan: MAC   Post-op Pain Management:    Induction: Intravenous  PONV Risk Score and Plan: TIVA, Midazolam and Treatment may vary due to age or medical condition  Airway Management Planned: Natural Airway and Nasal Cannula  Additional Equipment:   Intra-op Plan:   Post-operative Plan:   Informed Consent: I have reviewed the patients History and Physical, chart, labs and discussed the procedure including the risks, benefits and alternatives for the proposed anesthesia with the patient or authorized representative who has indicated his/her understanding and acceptance.       Plan Discussed with: CRNA  Anesthesia Plan Comments:         Anesthesia Quick Evaluation

## 2020-08-08 NOTE — Transfer of Care (Signed)
Immediate Anesthesia Transfer of Care Note  Patient: Ellen Fleming  Procedure(s) Performed: CATARACT EXTRACTION PHACO AND INTRAOCULAR LENS PLACEMENT (IOC) RIGHT 10.99 00:59.6 (Right )  Patient Location: PACU  Anesthesia Type: MAC  Level of Consciousness: awake, alert  and patient cooperative  Airway and Oxygen Therapy: Patient Spontanous Breathing   Post-op Assessment: Post-op Vital signs reviewed, Patient's Cardiovascular Status Stable, Respiratory Function Stable, Patent Airway and No signs of Nausea or vomiting  Post-op Vital Signs: Reviewed and stable  Complications: No complications documented.

## 2020-08-08 NOTE — Op Note (Signed)
PREOPERATIVE DIAGNOSIS:  Nuclear sclerotic cataract of the right eye.   POSTOPERATIVE DIAGNOSIS:  H25.12 Cataract   OPERATIVE PROCEDURE:@   SURGEON:  Ellen Manila, MD.   ANESTHESIA:  Anesthesiologist: Jola Babinski, MD CRNA: Jiles Garter, CRNA; Maree Krabbe, CRNA  1.      Managed anesthesia care. 2.      0.70ml of Shugarcaine was instilled in the eye following the paracentesis.   COMPLICATIONS:  None.   TECHNIQUE:   Stop and chop   DESCRIPTION OF PROCEDURE:  The patient was examined and consented in the preoperative holding area where the aforementioned topical anesthesia was applied to the right eye and then brought back to the Operating Room where the right eye was prepped and draped in the usual sterile ophthalmic fashion and a lid speculum was placed. A paracentesis was created with the side port blade and the anterior chamber was filled with viscoelastic. A near clear corneal incision was performed with the steel keratome. A continuous curvilinear capsulorrhexis was performed with a cystotome followed by the capsulorrhexis forceps. Hydrodissection and hydrodelineation were carried out with BSS on a blunt cannula. The lens was removed in a stop and chop  technique and the remaining cortical material was removed with the irrigation-aspiration handpiece. The capsular bag was inflated with viscoelastic and the Technis [id number]  lens was placed in the capsular bag without complication. The remaining viscoelastic was removed from the eye with the irrigation-aspiration handpiece. The wounds were hydrated. The anterior chamber was flushed with BSS and the eye was inflated to physiologic pressure. 0.44ml of Vigamox was placed in the anterior chamber. The wounds were found to be water tight. The eye was dressed with Combigan. The patient was given protective glasses to wear throughout the day and a shield with which to sleep tonight. The patient was also given drops with which to begin a drop  regimen today and will follow-up with me in one day. Implant Name Type Inv. Item Serial No. Manufacturer Lot No. LRB No. Used Action  LENS IOL TECNIS EYHANCE 20.5 - [id number] Intraocular Lens LENS IOL TECNIS EYHANCE 20.5 [id number] [employer]OH[employer]ON   Right 1 Implanted   Procedure(s): CATARACT EXTRACTION PHACO AND INTRAOCULAR LENS PLACEMENT (IOC) RIGHT 10.99 00:59.6 (Right)  Electronically signed: Galen Fleming 08/08/2020 11:22 AM

## 2020-08-09 ENCOUNTER — Encounter: Payer: Self-pay | Admitting: Ophthalmology

## 2020-12-16 ENCOUNTER — Other Ambulatory Visit: Payer: Self-pay | Admitting: Pain Medicine

## 2020-12-16 DIAGNOSIS — M7918 Myalgia, other site: Secondary | ICD-10-CM

## 2021-06-20 ENCOUNTER — Encounter: Admission: RE | Payer: Self-pay | Source: Home / Self Care

## 2021-06-20 ENCOUNTER — Ambulatory Visit: Admission: RE | Admit: 2021-06-20 | Payer: Medicare Other | Source: Home / Self Care | Admitting: Internal Medicine

## 2021-06-20 SURGERY — COLONOSCOPY WITH PROPOFOL
Anesthesia: General

## 2021-07-13 ENCOUNTER — Ambulatory Visit
Admission: EM | Admit: 2021-07-13 | Discharge: 2021-07-13 | Disposition: A | Discharge status: Home / Self Care | Payer: Medicare Other | Attending: Emergency Medicine | Admitting: Emergency Medicine

## 2021-07-13 ENCOUNTER — Encounter: Payer: Self-pay | Admitting: Emergency Medicine

## 2021-07-13 ENCOUNTER — Other Ambulatory Visit: Payer: Self-pay

## 2021-07-13 ENCOUNTER — Ambulatory Visit (INDEPENDENT_AMBULATORY_CARE_PROVIDER_SITE_OTHER): Payer: Medicare Other

## 2021-07-13 DIAGNOSIS — R0789 Other chest pain: Secondary | ICD-10-CM | POA: Diagnosis not present

## 2021-07-13 DIAGNOSIS — J189 Pneumonia, unspecified organism: Secondary | ICD-10-CM | POA: Diagnosis not present

## 2021-07-13 MED ORDER — ALBUTEROL SULFATE (2.5 MG/3ML) 0.083% IN NEBU: 2.5000 mg | INHALATION_SOLUTION | Freq: Four times a day (QID) | RESPIRATORY_TRACT | 1 refills | Status: DC | PRN

## 2021-07-13 MED ORDER — BENZONATATE 200 MG PO CAPS: 200.0000 mg | ORAL_CAPSULE | Freq: Three times a day (TID) | ORAL | 0 refills | Status: AC | PRN

## 2021-07-13 MED ORDER — AMOXICILLIN-POT CLAVULANATE 875-125 MG PO TABS: 1.0000 | ORAL_TABLET | Freq: Two times a day (BID) | ORAL | 0 refills | Status: AC

## 2021-07-13 MED ORDER — AZITHROMYCIN 250 MG PO TABS: 250.0000 mg | ORAL_TABLET | Freq: Every day | ORAL | 0 refills | Status: DC

## 2021-07-13 MED ORDER — ALBUTEROL SULFATE (2.5 MG/3ML) 0.083% IN NEBU
2.5000 mg | INHALATION_SOLUTION | Freq: Once | RESPIRATORY_TRACT | Status: AC
  Administered 2021-07-13: 2.5 mg via RESPIRATORY_TRACT

## 2021-07-13 MED ORDER — AEROCHAMBER PLUS MISC: 2 refills | Status: AC

## 2021-07-13 MED ORDER — ALBUTEROL SULFATE HFA 108 (90 BASE) MCG/ACT IN AERS: 1.0000 | INHALATION_SPRAY | RESPIRATORY_TRACT | 0 refills | Status: AC | PRN

## 2021-07-13 NOTE — Discharge Instructions (Addendum)
Finish the Augmentin and azithromycin, even if you feel better.  2 puffs from your albuterol inhaler using your spacer every 4 hours for 2 days, then every 6 hours for 2 days, then as needed.  We can back off on the albuterol if you start to improve sooner.  Take 400 mg of ibuprofen with 1000 mg of Tylenol 3-4 times a day as needed for pain.  Follow-up with your doctor in 4 weeks to get repeat chest x-ray to make sure that the pneumonia and fluid in your lung has cleared. ?

## 2021-07-13 NOTE — ED Triage Notes (Signed)
Pt present right side rib cage pain with shortness of breath and coughing. Pt states any movement on the right side make the pain hurt the worst.  ? ? ?
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
## 2021-09-12 ENCOUNTER — Ambulatory Visit: Admission: RE | Admit: 2021-09-12 | Discharge: 2021-09-12 | Payer: Medicare Other | Source: Ambulatory Visit

## 2021-09-12 ENCOUNTER — Ambulatory Visit (INDEPENDENT_AMBULATORY_CARE_PROVIDER_SITE_OTHER): Payer: Medicare Other

## 2021-09-12 ENCOUNTER — Other Ambulatory Visit: Payer: Self-pay

## 2021-09-12 ENCOUNTER — Emergency Department: Payer: Medicare Other

## 2021-09-12 ENCOUNTER — Encounter: Payer: Self-pay | Admitting: Emergency Medicine

## 2021-09-12 ENCOUNTER — Inpatient Hospital Stay
Admission: EM | Admit: 2021-09-12 | Discharge: 2021-09-14 | DRG: 871 | Disposition: A | Discharge status: Home / Self Care | Payer: Medicare Other | Attending: Internal Medicine | Admitting: Internal Medicine

## 2021-09-12 VITALS — BP 77/51 | HR 106 | Temp 100.1°F | Resp 18

## 2021-09-12 DIAGNOSIS — F411 Generalized anxiety disorder: Secondary | ICD-10-CM | POA: Diagnosis present

## 2021-09-12 DIAGNOSIS — A419 Sepsis, unspecified organism: Principal | ICD-10-CM | POA: Diagnosis present

## 2021-09-12 DIAGNOSIS — R051 Acute cough: Secondary | ICD-10-CM | POA: Diagnosis not present

## 2021-09-12 DIAGNOSIS — F17210 Nicotine dependence, cigarettes, uncomplicated: Secondary | ICD-10-CM | POA: Diagnosis present

## 2021-09-12 DIAGNOSIS — Z79899 Other long term (current) drug therapy: Secondary | ICD-10-CM

## 2021-09-12 DIAGNOSIS — I959 Hypotension, unspecified: Secondary | ICD-10-CM

## 2021-09-12 DIAGNOSIS — Z9842 Cataract extraction status, left eye: Secondary | ICD-10-CM

## 2021-09-12 DIAGNOSIS — Z961 Presence of intraocular lens: Secondary | ICD-10-CM | POA: Diagnosis present

## 2021-09-12 DIAGNOSIS — Z9841 Cataract extraction status, right eye: Secondary | ICD-10-CM | POA: Diagnosis not present

## 2021-09-12 DIAGNOSIS — E785 Hyperlipidemia, unspecified: Secondary | ICD-10-CM | POA: Diagnosis present

## 2021-09-12 DIAGNOSIS — Z8249 Family history of ischemic heart disease and other diseases of the circulatory system: Secondary | ICD-10-CM | POA: Diagnosis not present

## 2021-09-12 DIAGNOSIS — Z20822 Contact with and (suspected) exposure to covid-19: Secondary | ICD-10-CM | POA: Diagnosis present

## 2021-09-12 DIAGNOSIS — G894 Chronic pain syndrome: Secondary | ICD-10-CM | POA: Diagnosis present

## 2021-09-12 DIAGNOSIS — R0602 Shortness of breath: Secondary | ICD-10-CM

## 2021-09-12 DIAGNOSIS — F17200 Nicotine dependence, unspecified, uncomplicated: Secondary | ICD-10-CM | POA: Diagnosis present

## 2021-09-12 DIAGNOSIS — R6520 Severe sepsis without septic shock: Secondary | ICD-10-CM

## 2021-09-12 DIAGNOSIS — E876 Hypokalemia: Secondary | ICD-10-CM | POA: Diagnosis not present

## 2021-09-12 DIAGNOSIS — J9601 Acute respiratory failure with hypoxia: Secondary | ICD-10-CM | POA: Diagnosis present

## 2021-09-12 DIAGNOSIS — J189 Pneumonia, unspecified organism: Secondary | ICD-10-CM

## 2021-09-12 DIAGNOSIS — R000 Tachycardia, unspecified: Secondary | ICD-10-CM

## 2021-09-12 DIAGNOSIS — R509 Fever, unspecified: Secondary | ICD-10-CM

## 2021-09-12 DIAGNOSIS — F32A Depression, unspecified: Secondary | ICD-10-CM | POA: Diagnosis present

## 2021-09-12 DIAGNOSIS — I10 Essential (primary) hypertension: Secondary | ICD-10-CM | POA: Diagnosis present

## 2021-09-12 DIAGNOSIS — F17209 Nicotine dependence, unspecified, with unspecified nicotine-induced disorders: Secondary | ICD-10-CM | POA: Diagnosis not present

## 2021-09-12 DIAGNOSIS — Z882 Allergy status to sulfonamides status: Secondary | ICD-10-CM

## 2021-09-12 LAB — URINALYSIS, ROUTINE W REFLEX MICROSCOPIC
Bilirubin Urine: NEGATIVE
Glucose, UA: NEGATIVE mg/dL
Hgb urine dipstick: NEGATIVE
Ketones, ur: NEGATIVE mg/dL
Nitrite: NEGATIVE
Protein, ur: NEGATIVE mg/dL
Specific Gravity, Urine: 1.005 (ref 1.005–1.030)
pH: 7 (ref 5.0–8.0)

## 2021-09-12 LAB — COMPREHENSIVE METABOLIC PANEL
ALT: 27 U/L (ref 0–44)
AST: 35 U/L (ref 15–41)
Albumin: 3.7 g/dL (ref 3.5–5.0)
Alkaline Phosphatase: 125 U/L (ref 38–126)
Anion gap: 9 (ref 5–15)
BUN: 18 mg/dL (ref 8–23)
CO2: 25 mmol/L (ref 22–32)
Calcium: 9.2 mg/dL (ref 8.9–10.3)
Chloride: 99 mmol/L (ref 98–111)
Creatinine, Ser: 0.99 mg/dL (ref 0.44–1.00)
GFR, Estimated: >60 mL/min (ref >60)
Glucose, Bld: 129 mg/dL — ABNORMAL HIGH (ref 70–99)
Potassium: 3.6 mmol/L (ref 3.5–5.1)
Sodium: 133 mmol/L — ABNORMAL LOW (ref 135–145)
Total Bilirubin: 0.6 mg/dL (ref 0.3–1.2)
Total Protein: 7.5 g/dL (ref 6.5–8.1)

## 2021-09-12 LAB — CBC WITH DIFFERENTIAL/PLATELET
Abs Immature Granulocytes: 0.36 10*3/uL — ABNORMAL HIGH (ref 0.00–0.07)
Basophils Absolute: 0.1 10*3/uL (ref 0.0–0.1)
Basophils Relative: 0 %
Eosinophils Absolute: 0 10*3/uL (ref 0.0–0.5)
Eosinophils Relative: 0 %
HCT: 35.4 % — ABNORMAL LOW (ref 36.0–46.0)
Hemoglobin: 11.6 g/dL — ABNORMAL LOW (ref 12.0–15.0)
Immature Granulocytes: 1 %
Lymphocytes Relative: 5 %
Lymphs Abs: 1.4 10*3/uL (ref 0.7–4.0)
MCH: 30.3 pg (ref 26.0–34.0)
MCHC: 32.8 g/dL (ref 30.0–36.0)
MCV: 92.4 fL (ref 80.0–100.0)
Monocytes Absolute: 1.6 10*3/uL — ABNORMAL HIGH (ref 0.1–1.0)
Monocytes Relative: 5 %
Neutro Abs: 26 10*3/uL — ABNORMAL HIGH (ref 1.7–7.7)
Neutrophils Relative %: 89 %
Platelets: 313 10*3/uL (ref 150–400)
RBC: 3.83 MIL/uL — ABNORMAL LOW (ref 3.87–5.11)
RDW: 14.1 % (ref 11.5–15.5)
Smear Review: NORMAL
WBC: 29.4 10*3/uL — ABNORMAL HIGH (ref 4.0–10.5)
nRBC: 0 % (ref 0.0–0.2)

## 2021-09-12 LAB — TROPONIN I (HIGH SENSITIVITY)
Troponin I (High Sensitivity): 15 ng/L (ref <18)
Troponin I (High Sensitivity): 16 ng/L (ref <18)

## 2021-09-12 LAB — APTT: aPTT: 32 seconds (ref 24–36)

## 2021-09-12 LAB — LACTIC ACID, PLASMA: Lactic Acid, Venous: 0.9 mmol/L (ref 0.5–1.9)

## 2021-09-12 LAB — RESP PANEL BY RT-PCR (FLU A&B, COVID) ARPGX2
Influenza A by PCR: NEGATIVE
Influenza B by PCR: NEGATIVE
SARS Coronavirus 2 by RT PCR: NEGATIVE

## 2021-09-12 LAB — PROTIME-INR
INR: 1.1 (ref 0.8–1.2)
Prothrombin Time: 13.8 seconds (ref 11.4–15.2)

## 2021-09-12 MED ORDER — LACTATED RINGERS IV BOLUS (SEPSIS)
1000.0000 mL | Freq: Once | INTRAVENOUS | Status: AC
  Administered 2021-09-12: 1000 mL via INTRAVENOUS

## 2021-09-12 MED ORDER — SODIUM CHLORIDE 0.9 % IV SOLN
500.0000 mg | INTRAVENOUS | Status: DC
  Administered 2021-09-12 – 2021-09-13 (×2): 500 mg via INTRAVENOUS
  Filled 2021-09-12 (×3): qty 5

## 2021-09-12 MED ORDER — SODIUM CHLORIDE 0.9 % IV SOLN
2.0000 g | INTRAVENOUS | Status: DC
  Administered 2021-09-12 – 2021-09-13 (×2): 2 g via INTRAVENOUS
  Filled 2021-09-12 (×3): qty 20

## 2021-09-12 MED ORDER — ROSUVASTATIN CALCIUM 10 MG PO TABS
10.0000 mg | ORAL_TABLET | Freq: Every day | ORAL | Status: DC
  Administered 2021-09-13: 10 mg via ORAL
  Filled 2021-09-12: qty 1

## 2021-09-12 MED ORDER — ACETAMINOPHEN 325 MG PO TABS
650.0000 mg | ORAL_TABLET | Freq: Four times a day (QID) | ORAL | Status: DC | PRN
  Administered 2021-09-13: 650 mg via ORAL
  Filled 2021-09-12: qty 2

## 2021-09-12 MED ORDER — POLYETHYLENE GLYCOL 3350 17 G PO PACK: 17.0000 g | PACK | Freq: Every day | ORAL | Status: DC | PRN

## 2021-09-12 MED ORDER — ALPRAZOLAM 0.5 MG PO TABS
0.5000 mg | ORAL_TABLET | Freq: Two times a day (BID) | ORAL | Status: DC | PRN
  Administered 2021-09-12 – 2021-09-13 (×2): 0.5 mg via ORAL
  Filled 2021-09-12 (×2): qty 1

## 2021-09-12 MED ORDER — GUAIFENESIN-DM 100-10 MG/5ML PO SYRP: 5.0000 mL | ORAL_SOLUTION | ORAL | Status: DC | PRN

## 2021-09-12 MED ORDER — NICOTINE 14 MG/24HR TD PT24
14.0000 mg | MEDICATED_PATCH | Freq: Every day | TRANSDERMAL | Status: DC
  Administered 2021-09-13: 14 mg via TRANSDERMAL
  Filled 2021-09-12: qty 1

## 2021-09-12 MED ORDER — LACTATED RINGERS IV SOLN: INTRAVENOUS | Status: AC

## 2021-09-12 MED ORDER — ACETAMINOPHEN 500 MG PO TABS: 1000.0000 mg | ORAL_TABLET | Freq: Once | ORAL | Status: DC

## 2021-09-12 MED ORDER — CEFDINIR 300 MG PO CAPS: 300.0000 mg | ORAL_CAPSULE | Freq: Two times a day (BID) | ORAL | 0 refills | Status: DC

## 2021-09-12 MED ORDER — MELATONIN 5 MG PO TABS: 5.0000 mg | ORAL_TABLET | Freq: Every evening | ORAL | Status: DC | PRN

## 2021-09-12 MED ORDER — SERTRALINE HCL 50 MG PO TABS
50.0000 mg | ORAL_TABLET | Freq: Every day | ORAL | Status: DC
  Administered 2021-09-13: 50 mg via ORAL
  Filled 2021-09-12: qty 1

## 2021-09-12 MED ORDER — LACTATED RINGERS IV SOLN: INTRAVENOUS | Status: DC

## 2021-09-12 MED ORDER — IPRATROPIUM-ALBUTEROL 0.5-2.5 (3) MG/3ML IN SOLN
3.0000 mL | Freq: Four times a day (QID) | RESPIRATORY_TRACT | Status: DC
  Administered 2021-09-12 – 2021-09-13 (×2): 3 mL via RESPIRATORY_TRACT
  Filled 2021-09-12 (×2): qty 3

## 2021-09-12 MED ORDER — SODIUM CHLORIDE 3 % IN NEBU
4.0000 mL | INHALATION_SOLUTION | Freq: Two times a day (BID) | RESPIRATORY_TRACT | Status: DC
Start: 2021-09-12 — End: 2021-09-14
  Administered 2021-09-12 – 2021-09-14 (×4): 4 mL via RESPIRATORY_TRACT
  Filled 2021-09-12 (×4): qty 4

## 2021-09-12 MED ORDER — ONDANSETRON HCL 4 MG/2ML IJ SOLN: 4.0000 mg | Freq: Four times a day (QID) | INTRAMUSCULAR | Status: DC | PRN

## 2021-09-12 MED ORDER — GUAIFENESIN ER 600 MG PO TB12
600.0000 mg | ORAL_TABLET | Freq: Two times a day (BID) | ORAL | Status: DC
Start: 2021-09-12 — End: 2021-09-14
  Administered 2021-09-12 – 2021-09-13 (×3): 600 mg via ORAL
  Filled 2021-09-12 (×3): qty 1

## 2021-09-12 MED ORDER — IPRATROPIUM-ALBUTEROL 0.5-2.5 (3) MG/3ML IN SOLN
3.0000 mL | Freq: Once | RESPIRATORY_TRACT | Status: AC
  Administered 2021-09-12: 3 mL via RESPIRATORY_TRACT
  Filled 2021-09-12: qty 3

## 2021-09-12 MED ORDER — SODIUM CHLORIDE 0.9 % IV BOLUS
1000.0000 mL | Freq: Once | INTRAVENOUS | Status: AC
  Administered 2021-09-12: 1000 mL via INTRAVENOUS

## 2021-09-12 MED ORDER — DOXYCYCLINE HYCLATE 100 MG PO CAPS: 100.0000 mg | ORAL_CAPSULE | Freq: Two times a day (BID) | ORAL | 0 refills | Status: DC

## 2021-09-12 MED ORDER — ADULT MULTIVITAMIN W/MINERALS CH
1.0000 | ORAL_TABLET | Freq: Every day | ORAL | Status: DC
  Administered 2021-09-13: 1 via ORAL
  Filled 2021-09-12: qty 1

## 2021-09-12 MED ORDER — ENOXAPARIN SODIUM 40 MG/0.4ML IJ SOSY
40.0000 mg | PREFILLED_SYRINGE | INTRAMUSCULAR | Status: DC
  Administered 2021-09-12 – 2021-09-13 (×2): 40 mg via SUBCUTANEOUS
  Filled 2021-09-12 (×2): qty 0.4

## 2021-09-12 NOTE — ED Triage Notes (Signed)
Pt c/o chills, bodyaches, diarrhea, some cough, HA started this morning when she woke up.

## 2021-09-12 NOTE — Sepsis Progress Note (Signed)
eLink is following this Code Sepsis. °

## 2021-09-12 NOTE — ED Provider Triage Note (Signed)
  Emergency Medicine Provider Triage Evaluation Note  Ellen Fleming , a 71 y.o.female,  was evaluated in triage.  Pt complains of shortness of breath, weakness, cough/congestion, body aches, diarrhea x1 day.  She was seen at urgent care today, where she received a chest x-ray which showed possible pneumonia.  Review of Systems  Positive: Shortness of breath, weakness, cough/congestion, myalgias, diarrhea Negative: Denies fever, chest pain, vomiting  Physical Exam   Vitals:   09/12/21 1259  BP: (!) 79/54  Pulse: (!) 106  Resp: 18  Temp: 98.2 F (36.8 C)  SpO2: 94%   Gen:   Awake, no distress   Resp:  Normal effort  MSK:   Moves extremities without difficulty  Other:    Medical Decision Making  Given the patient's initial medical screening exam, the following diagnostic evaluation has been ordered. The patient will be placed in the appropriate treatment space, once one is available, to complete the evaluation and treatment. I have discussed the plan of care with the patient and I have advised the patient that an ED physician or mid-level practitioner will reevaluate their condition after the test results have been received, as the results may give them additional insight into the type of treatment they may need.    Diagnostics: Labs, UA, EKG  Treatments: Tylenol, IV fluids   Teodoro Spray, [place] 09/12/21 1309

## 2021-09-12 NOTE — ED Triage Notes (Signed)
Diagnosed with pneumonia today at [facility]  While there, BP noted to by 77/51.  Patient is AAOx3.  NAD
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
## 2021-09-12 NOTE — ED Notes (Signed)
Patient is being discharged from the Urgent Care and sent to the Emergency Department via personal vehicle . Per Wendee BeaversKelly Tate NP, patient is in need of higher level of care due to Hypotension,Hypoxia, Tachycardia. Patient is aware and verbalizes understanding of plan of care.  Vitals:   09/12/21 1151 09/12/21 1239  BP:  (!) 77/51  Pulse: (!) 109 (!) 106  Resp:  18  Temp:  100.1 F (37.8 C)  SpO2: 91% (!) 89%

## 2021-09-12 NOTE — Consult Note (Signed)
CODE SEPSIS - PHARMACY COMMUNICATION  **Broad Spectrum Antibiotics should be administered within 1 hour of Sepsis diagnosis**  Time Code Sepsis Called/Page Received: 1347  Antibiotics Ordered: Azithromycin, Rocephin  Time of 1st antibiotic administration: 1427  Additional action taken by pharmacy: none  If necessary, Name of Provider/Nurse Contacted: n/a    Bettey Costa ,PharmD Clinical Pharmacist  09/12/2021  2:30 PM

## 2021-09-12 NOTE — Discharge Instructions (Addendum)
Go to the emergency department

## 2021-09-12 NOTE — ED Provider Notes (Signed)
[facility] Provider Note   Event Date/Time   First MD Initiated Contact with Patient 09/12/21 1326     (approximate) History  Hypotension  HPI Ellen Fleming is a 71 y.o. female who presents via urgent care with complaints of fever, chills, headache, body aches, cough, shortness of breath, and diarrhea over the past 24 hours.  Patient states she has 1 episode of nonbloody diarrhea this morning.  Patient states that she has tried ibuprofen at home for the fever that has partially alleviated this pain.  Patient does endorse 50-year plus smoking history.  Patient denies any recent travel or sick contacts.  Patient was sent here after she was found to have leukocytosis to 28,000 as well as pneumonia on 2 view chest x-ray. ROS: Patient currently denies any vision changes, tinnitus, difficulty speaking, facial droop, sore throat, chest pain, abdominal pain, nausea/vomiting/diarrhea, dysuria, or weakness/numbness/paresthesias in any extremity   Physical Exam  Triage Vital Signs: ED Triage Vitals  Enc Vitals Group     BP 09/12/21 1259 (!) 79/54     Pulse Rate 09/12/21 1259 (!) 106     Resp 09/12/21 1259 18     Temp 09/12/21 1259 98.2 F (36.8 C)     Temp src --      SpO2 09/12/21 1259 94 %     Weight 09/12/21 1257 130 lb 1.1 oz (59 kg)     Height 09/12/21 1257  (1.575 m)     Head Circumference --      Peak Flow --      Pain Score 09/12/21 1257 0     Pain Loc --      Pain Edu? --      Excl. in GC? --    Most recent vital signs: Vitals:   09/12/21 1259 09/12/21 1326  BP: (!) 79/54 (!) 83/54  Pulse: (!) 106 99  Resp: 18 (!) 24  Temp: 98.2 F (36.8 C)   SpO2: 94% 98%   General: Awake, oriented x4. CV:  Good peripheral perfusion.  Resp:  Increased effort.  Inspiratory and expiratory wheezes over bilateral lung fields Abd:  No distention.  Other:  Elderly overweight Caucasian female laying in bed in mild respiratory distress ED Results / Procedures /  Treatments  Labs (all labs ordered are listed, but only abnormal results are displayed) Labs Reviewed  COMPREHENSIVE METABOLIC PANEL - Abnormal; Notable for the following components:      Result Value   Sodium 133 (*)    Glucose, Bld 129 (*)    All other components within normal limits  CBC WITH DIFFERENTIAL/PLATELET - Abnormal; Notable for the following components:   WBC 29.4 (*)    RBC 3.83 (*)    Hemoglobin 11.6 (*)    HCT 35.4 (*)    Neutro Abs 26.0 (*)    Monocytes Absolute 1.6 (*)    Abs Immature Granulocytes 0.36 (*)    All other components within normal limits  RESP PANEL BY RT-PCR (FLU A&B, COVID) ARPGX2  CULTURE, BLOOD (ROUTINE X 2)  CULTURE, BLOOD (ROUTINE X 2)  URINE CULTURE  LACTIC ACID, PLASMA  URINALYSIS, ROUTINE W REFLEX MICROSCOPIC  LACTIC ACID, PLASMA  PROTIME-INR  APTT  URINALYSIS, COMPLETE (UACMP) WITH MICROSCOPIC  TROPONIN I (HIGH SENSITIVITY)  TROPONIN I (HIGH SENSITIVITY)   EKG ED ECG REPORT I, Merwyn Katos, the attending physician, personally viewed and interpreted this ECG. Date: 09/12/2021 EKG Time: 1306 Rate: 106 Rhythm: Tachycardic sinus rhythm  QRS Axis: normal Intervals: normal ST/T Wave abnormalities: normal Narrative Interpretation: Tachycardic sinus rhythm.  No evidence of acute ischemia RADIOLOGY ED MD interpretation: 2 view chest x-ray interpreted by me and shows residual patchy density on the right lung base concerning for pneumonia -Agree with radiology assessment Official radiology report(s): DG Chest 2 View  Result Date: 09/12/2021 CLINICAL DATA:  Shortness of breath and cough.  Recent pneumonia. EXAM: CHEST - 2 VIEW COMPARISON:  07/13/2021.  07/03/2019. FINDINGS: Heart size remains normal. Mediastinal shadows are normal. Mild chronic scarring persists at the left lung base. Mild patchy density persists at the right lung base, that could represent scarring or mild residual pneumonia. No visible effusion. No worsening or new  finding. IMPRESSION: Mild residual patchy density at the right lung base that could be post inflammatory scarring or mild residual pneumonia. Electronically Signed   By: Paulina Fusi M.D.   On: 09/12/2021 12:19   PROCEDURES: Critical Care performed: Yes, see critical care procedure note(s) .1-3 Lead EKG Interpretation Performed by: Merwyn Katos, MD Authorized by: Merwyn Katos, MD     Interpretation: normal     ECG rate:  97   ECG rate assessment: normal     Rhythm: sinus rhythm     Ectopy: none     Conduction: normal   CRITICAL CARE Performed by: Merwyn Katos  Total critical care time: 41 minutes  Critical care time was exclusive of separately billable procedures and treating other patients.  Critical care was necessary to treat or prevent imminent or life-threatening deterioration.  Critical care was time spent personally by me on the following activities: development of treatment plan with patient and/or surrogate as well as nursing, discussions with consultants, evaluation of patient's response to treatment, examination of patient, obtaining history from patient or surrogate, ordering and performing treatments and interventions, ordering and review of laboratory studies, ordering and review of radiographic studies, pulse oximetry and re-evaluation of patient's condition.  MEDICATIONS ORDERED IN ED: Medications  acetaminophen (TYLENOL) tablet 1,000 mg (1,000 mg Oral Not Given 09/12/21 1335)  lactated ringers infusion ( Intravenous New Bag/Given 09/12/21 1427)  cefTRIAXone (ROCEPHIN) 2 g in sodium chloride 0.9 % 100 mL IVPB (0 g Intravenous Stopped 09/12/21 1438)  azithromycin (ZITHROMAX) 500 mg in sodium chloride 0.9 % 250 mL IVPB (0 mg Intravenous Stopped 09/12/21 1529)  sodium chloride 0.9 % bolus 1,000 mL (0 mLs Intravenous Stopped 09/12/21 1520)  lactated ringers bolus 1,000 mL (0 mLs Intravenous Stopped 09/12/21 1521)    And  lactated ringers bolus 1,000 mL (0 mLs Intravenous  Stopped 09/12/21 1520)  ipratropium-albuterol (DUONEB) 0.5-2.5 (3) MG/3ML nebulizer solution 3 mL (3 mLs Nebulization Given 09/12/21 1427)   IMPRESSION / MDM / ASSESSMENT AND PLAN / ED COURSE  I reviewed the triage vital signs and the nursing notes.                             The patient is on the cardiac monitor to evaluate for evidence of arrhythmia and/or significant heart rate changes. Patient's presentation is most consistent with acute presentation with potential threat to life or bodily function. The Pt presents with cough, fevers, body aches, diarrhea highly concerning for sepsis (suspected pulmonary source). At this time, the Pt is satting well on room air, hypotensive.  Will start empiric antibiotics and fluids.  Due to hypotension, will administer fluids gradually with frequent reassessment. Have low suspicion for a GI, skin/soft  tissue, or CNS source at this time, but will reconsider if initial workup is unremarkable.  - CBC, BMP, LFTs - VBG - UA - BCx x2, Lactate - EKG - CXR - Empiric Abx: Rocephin and azithromycin - Fluids: 30cc/kg stated Ringer's  Care of this patient will be signed out to the oncoming physician at the end of my shift.  All pertinent patient information conveyed and all questions answered.  All further care and disposition decisions will be made by the oncoming physician.   FINAL CLINICAL IMPRESSION(S) / ED DIAGNOSES   Final diagnoses:  Community acquired pneumonia of right lower lobe of lung  Sepsis with acute hypoxic respiratory failure, due to unspecified organism, unspecified whether septic shock present (HCC)   Rx / DC Orders   ED Discharge Orders     None      Note:  This document was prepared using Dragon voice recognition software and may include unintentional dictation errors.   Merwyn Katos, MD 09/12/21 1537

## 2021-09-13 ENCOUNTER — Encounter: Payer: Self-pay | Admitting: Internal Medicine

## 2021-09-13 ENCOUNTER — Inpatient Hospital Stay: Payer: Medicare Other

## 2021-09-13 DIAGNOSIS — I10 Essential (primary) hypertension: Secondary | ICD-10-CM | POA: Diagnosis not present

## 2021-09-13 DIAGNOSIS — F17209 Nicotine dependence, unspecified, with unspecified nicotine-induced disorders: Secondary | ICD-10-CM | POA: Diagnosis not present

## 2021-09-13 DIAGNOSIS — E876 Hypokalemia: Secondary | ICD-10-CM | POA: Diagnosis not present

## 2021-09-13 DIAGNOSIS — J189 Pneumonia, unspecified organism: Secondary | ICD-10-CM | POA: Diagnosis not present

## 2021-09-13 DIAGNOSIS — A419 Sepsis, unspecified organism: Principal | ICD-10-CM

## 2021-09-13 LAB — CBC WITH DIFFERENTIAL/PLATELET
Abs Immature Granulocytes: 0.18 10*3/uL — ABNORMAL HIGH (ref 0.00–0.07)
Basophils Absolute: 0.1 10*3/uL (ref 0.0–0.1)
Basophils Relative: 0 %
Eosinophils Absolute: 0.1 10*3/uL (ref 0.0–0.5)
Eosinophils Relative: 1 %
HCT: 28.7 % — ABNORMAL LOW (ref 36.0–46.0)
Hemoglobin: 9.6 g/dL — ABNORMAL LOW (ref 12.0–15.0)
Immature Granulocytes: 1 %
Lymphocytes Relative: 11 %
Lymphs Abs: 2.3 10*3/uL (ref 0.7–4.0)
MCH: 29.9 pg (ref 26.0–34.0)
MCHC: 33.4 g/dL (ref 30.0–36.0)
MCV: 89.4 fL (ref 80.0–100.0)
Monocytes Absolute: 1.2 10*3/uL — ABNORMAL HIGH (ref 0.1–1.0)
Monocytes Relative: 6 %
Neutro Abs: 17.1 10*3/uL — ABNORMAL HIGH (ref 1.7–7.7)
Neutrophils Relative %: 81 %
Platelets: 263 10*3/uL (ref 150–400)
RBC: 3.21 MIL/uL — ABNORMAL LOW (ref 3.87–5.11)
RDW: 14.1 % (ref 11.5–15.5)
WBC: 20.9 10*3/uL — ABNORMAL HIGH (ref 4.0–10.5)
nRBC: 0 % (ref 0.0–0.2)

## 2021-09-13 LAB — COMPREHENSIVE METABOLIC PANEL
ALT: 20 U/L (ref 0–44)
AST: 22 U/L (ref 15–41)
Albumin: 2.9 g/dL — ABNORMAL LOW (ref 3.5–5.0)
Alkaline Phosphatase: 106 U/L (ref 38–126)
Anion gap: 7 (ref 5–15)
BUN: 13 mg/dL (ref 8–23)
CO2: 26 mmol/L (ref 22–32)
Calcium: 8.4 mg/dL — ABNORMAL LOW (ref 8.9–10.3)
Chloride: 103 mmol/L (ref 98–111)
Creatinine, Ser: 0.64 mg/dL (ref 0.44–1.00)
GFR, Estimated: >60 mL/min (ref >60)
Glucose, Bld: 101 mg/dL — ABNORMAL HIGH (ref 70–99)
Potassium: 3.4 mmol/L — ABNORMAL LOW (ref 3.5–5.1)
Sodium: 136 mmol/L (ref 135–145)
Total Bilirubin: 0.4 mg/dL (ref 0.3–1.2)
Total Protein: 6.2 g/dL — ABNORMAL LOW (ref 6.5–8.1)

## 2021-09-13 LAB — MAGNESIUM: Magnesium: 1.6 mg/dL — ABNORMAL LOW (ref 1.7–2.4)

## 2021-09-13 LAB — PHOSPHORUS: Phosphorus: 2.9 mg/dL (ref 2.5–4.6)

## 2021-09-13 LAB — STREP PNEUMONIAE URINARY ANTIGEN: Strep Pneumo Urinary Antigen: POSITIVE — AB

## 2021-09-13 LAB — NOVEL CORONAVIRUS, NAA: SARS-CoV-2, NAA: NOT DETECTED

## 2021-09-13 LAB — HIV ANTIBODY (ROUTINE TESTING W REFLEX): HIV Screen 4th Generation wRfx: NONREACTIVE

## 2021-09-13 LAB — PROCALCITONIN: Procalcitonin: 1.13 ng/mL

## 2021-09-13 MED ORDER — IPRATROPIUM-ALBUTEROL 0.5-2.5 (3) MG/3ML IN SOLN
3.0000 mL | Freq: Two times a day (BID) | RESPIRATORY_TRACT | Status: DC
Start: 2021-09-13 — End: 2021-09-14
  Administered 2021-09-13 – 2021-09-14 (×3): 3 mL via RESPIRATORY_TRACT
  Filled 2021-09-13 (×3): qty 3

## 2021-09-13 MED ORDER — PROPRANOLOL HCL 10 MG PO TABS
10.0000 mg | ORAL_TABLET | Freq: Two times a day (BID) | ORAL | Status: DC
  Administered 2021-09-13 (×2): 10 mg via ORAL
  Filled 2021-09-13 (×3): qty 1

## 2021-09-13 MED ORDER — BENZONATATE 100 MG PO CAPS: 200.0000 mg | ORAL_CAPSULE | Freq: Three times a day (TID) | ORAL | Status: DC | PRN

## 2021-09-13 MED ORDER — LISINOPRIL 20 MG PO TABS
20.0000 mg | ORAL_TABLET | Freq: Every day | ORAL | Status: DC
  Administered 2021-09-13: 20 mg via ORAL
  Filled 2021-09-13: qty 1

## 2021-09-13 MED ORDER — MAGNESIUM SULFATE 2 GM/50ML IV SOLN
2.0000 g | Freq: Once | INTRAVENOUS | Status: AC
  Administered 2021-09-13: 2 g via INTRAVENOUS
  Filled 2021-09-13: qty 50

## 2021-09-13 MED ORDER — ALBUTEROL SULFATE (2.5 MG/3ML) 0.083% IN NEBU: 3.0000 mL | INHALATION_SOLUTION | RESPIRATORY_TRACT | Status: DC | PRN

## 2021-09-13 MED ORDER — CYCLOBENZAPRINE HCL 10 MG PO TABS
10.0000 mg | ORAL_TABLET | Freq: Three times a day (TID) | ORAL | Status: DC
  Administered 2021-09-13 (×2): 10 mg via ORAL
  Filled 2021-09-13 (×3): qty 1

## 2021-09-13 MED ORDER — HYDROCHLOROTHIAZIDE 25 MG PO TABS
25.0000 mg | ORAL_TABLET | Freq: Every day | ORAL | Status: DC
  Administered 2021-09-13: 25 mg via ORAL
  Filled 2021-09-13: qty 1

## 2021-09-13 MED ORDER — POTASSIUM CHLORIDE CRYS ER 20 MEQ PO TBCR
40.0000 meq | EXTENDED_RELEASE_TABLET | Freq: Once | ORAL | Status: AC
  Administered 2021-09-13: 40 meq via ORAL
  Filled 2021-09-13: qty 2

## 2021-09-13 MED ORDER — IPRATROPIUM-ALBUTEROL 0.5-2.5 (3) MG/3ML IN SOLN: 3.0000 mL | Freq: Two times a day (BID) | RESPIRATORY_TRACT | Status: DC

## 2021-09-13 NOTE — Assessment & Plan Note (Signed)
Sepsis resolved.  Continue antibiotics

## 2021-09-13 NOTE — Assessment & Plan Note (Signed)
Continue lisinopril, Inderal, HCTZ

## 2021-09-13 NOTE — Progress Notes (Signed)
  Progress Note   Patient: Ellen Fleming [id number] DOB: [birth year] DOA: 09/12/2021     1 DOS: the patient was seen and examined on 09/13/2021   Brief hospital course: 71 y.o. female with medical history significant for ongoing tobacco use, hypertension, hyperlipidemia, chronic anxiety/depression is admitted for pneumonia  6/8 -CT chest, continue antibiotics   Assessment and Plan: * CAP (community acquired pneumonia) Obtain CT chest.  Continue Rocephin and Zithromax.  Leukocytosis improving  Nicotine dependence Nicotine patch  Hypokalemia Replete and recheck, replete magnesium also as it is low/hypomagnesemia  Sepsis due to pneumonia ([place]) Sepsis resolved.  Continue antibiotics  Generalized anxiety disorder Continue Xanax as needed  Depression Continue Zoloft  Hypertension Continue lisinopril, Inderal, HCTZ        Subjective: Still coughing, feeling short of breath  Physical Exam: Vitals:   09/13/21 0307 09/13/21 0716 09/13/21 0736 09/13/21 1026  BP:   (!) 113/47 124/71  Pulse:   (!) 107 (!) 109  Resp:   16 16  Temp:   99.5 F (37.5 C) 98.7 F (37.1 C)  TempSrc:   Oral Oral  SpO2: 95% 98% 97% 96%  Weight:      Height:       . General: 71 y.o. year-old female well developed well nourished in no acute distress.  Alert and oriented x3. . Cardiovascular: Regular rate and rhythm with no rubs or gallops.  No thyromegaly or JVD noted.  No lower extremity edema. 2/4 pulses in all 4 extremities. Marland Kitchen Respiratory: Mild rales at bases with no wheezing noted.  Poor inspiratory effort. . Abdomen: Soft nontender nondistended with normal bowel sounds x4 quadrants. . Muskuloskeletal: No cyanosis, clubbing or edema noted bilaterally . Neuro: CN II-XII intact, strength, sensation, reflexes . Skin: No ulcerative lesions noted or rashes . Psychiatry: Judgement and insight appear normal. Mood is appropriate for condition and setting .  Data Reviewed:  K3.4, mag 1.6  Family  Communication: None  Disposition: Status is: Inpatient Remains inpatient appropriate because: Treatment of pneumonia and sepsis   Planned Discharge Destination: Home    DVT prophylaxis-Lovenox Time spent: 35 minutes  Author: Max Sane, MD 09/13/2021 12:54 PM  For on call review [email or web address].

## 2021-09-13 NOTE — Assessment & Plan Note (Addendum)
Obtain CT chest.  Continue Rocephin and Zithromax.  Leukocytosis improving

## 2021-09-13 NOTE — Evaluation (Addendum)
Physical Therapy Evaluation Patient Details Name: MERYEM HAERTEL MRN: [id number] DOB: [birth year] Today's Date: 09/13/2021  History of Present Illness  Pt is a 71 y/o F with PMH significant for ongoing tobacco use, hypertension, hyperlipidemia, chronic anxiety/depression, who initially presented to urgent care with complaints of productive cough, shortness of breath, subjective fevers and chills. MD assesment includes Sepsis secondary to right lower lobe community-acquired pneumonia.   Clinical Impression  Pt was pleasant and motivated to participate during the session and put forth good effort throughout. Pt is able to perform bed mobility w/ supervision with no difficulty and with good sitting balance EOB. Pt is able to perform transfers CGA and able to safely navigate bed to chair. Pt was able to ambulate around nurse's station CGA with RW and with no AD with good stability and no LOB. Pt is able to perform higher level balance exercises and is able to self correct min LOB. Pt was left in recliner with all needs met. Pt will benefit from HHPT upon discharge to safely address deficits listed in patient problem list and eventual return to PLOF and to maximize function to decrease risk of falls.      Recommendations for follow up therapy are one component of a multi-disciplinary discharge planning process, led by the attending physician.  Recommendations may be updated based on patient status, additional functional criteria and insurance authorization.  Follow Up Recommendations Home health PT    Assistance Recommended at Discharge Intermittent Supervision/Assistance  Patient can return home with the following  A little help with walking and/or transfers;A little help with bathing/dressing/bathroom;Assistance with cooking/housework;Help with stairs or ramp for entrance    Equipment Recommendations None recommended by PT  Recommendations for Other Services       Functional Status Assessment  Patient has had a recent decline in their functional status and demonstrates the ability to make significant improvements in function in a reasonable and predictable amount of time.     Precautions / Restrictions Precautions Precautions: Fall Restrictions Weight Bearing Restrictions: No      Mobility  Bed Mobility Overal bed mobility: Needs Assistance Bed Mobility: Supine to Sit     Supine to sit: Supervision          Transfers Overall transfer level: Needs assistance Equipment used: Rolling walker (2 wheels) Transfers: Sit to/from Stand Sit to Stand: Min guard                Ambulation/Gait Ambulation/Gait assistance: Min guard Gait Distance (Feet): 160 Feet Assistive device: Rolling walker (2 wheels), None Gait Pattern/deviations: WFL(Within Functional Limits), Step-through pattern Gait velocity: decreased     General Gait Details: Pt w/ good stability and no LOB using RW and no AD  Stairs            Wheelchair Mobility    Modified Rankin (Stroke Patients Only)       Balance Overall balance assessment: Needs assistance Sitting-balance support: Feet supported Sitting balance-Leahy Scale: Good     Standing balance support: No upper extremity supported, During functional activity, Bilateral upper extremity supported Standing balance-Leahy Scale: Good   Single Leg Stance - Right Leg: 5 Single Leg Stance - Left Leg: 5 Tandem Stance - Right Leg: 10 Tandem Stance - Left Leg: 10       High Level Balance Comments: Pt able to correct min LOB             Pertinent Vitals/Pain Pain Assessment Pain Assessment: 0-10 Pain Score: 5  Pain  Location: headache Pain Descriptors / Indicators: Aching Pain Intervention(s): Monitored during session, Premedicated before session, Repositioned    Home Living Family/patient expects to be discharged to:: Private residence Living Arrangements: Alone Available Help at Discharge: Family;Available  PRN/intermittently Type of Home: House Home Access: Ramped entrance       Home Layout: One level Home Equipment: None      Prior Function Prior Level of Function : Independent/Modified Independent             Mobility Comments: Independent community ambulator using no AD and no hx of falls ADLs Comments: Independent w/ ADLs     Hand Dominance        Extremity/Trunk Assessment   Upper Extremity Assessment Upper Extremity Assessment: Overall WFL for tasks assessed    Lower Extremity Assessment Lower Extremity Assessment: Defer to PT evaluation       Communication   Communication: No difficulties  Cognition Arousal/Alertness: Awake/alert Behavior During Therapy: WFL for tasks assessed/performed Overall Cognitive Status: Within Functional Limits for tasks assessed                                          General Comments      Exercises     Assessment/Plan    PT Assessment Patient needs continued PT services  PT Problem List Decreased strength;Decreased mobility;Decreased activity tolerance;Decreased balance       PT Treatment Interventions DME instruction;Gait training;Stair training;Functional mobility training;Therapeutic activities;Therapeutic exercise;Balance training;Patient/family education    PT Goals (Current goals can be found in the Care Plan section)  Acute Rehab PT Goals Patient Stated Goal: Pt would like to regain strength PT Goal Formulation: With patient Time For Goal Achievement: 09/26/21 Potential to Achieve Goals: Good    Frequency Min 2X/week     Co-evaluation               AM-PAC PT "6 Clicks" Mobility  Outcome Measure Help needed turning from your back to your side while in a flat bed without using bedrails?: None Help needed moving from lying on your back to sitting on the side of a flat bed without using bedrails?: None Help needed moving to and from a bed to a chair (including a wheelchair)?:  None Help needed standing up from a chair using your arms (e.g., wheelchair or bedside chair)?: None Help needed to walk in hospital room?: None Help needed climbing 3-5 steps with a railing? : A Little 6 Click Score: 23    End of Session Equipment Utilized During Treatment: Gait belt Activity Tolerance: Patient tolerated treatment well;No increased pain Patient left: in chair;with call bell/phone within reach;with chair alarm set Nurse Communication: Mobility status PT Visit Diagnosis: Muscle weakness (generalized) (M62.81);Difficulty in walking, not elsewhere classified (R26.2)    Time: 2947-6546 PT Time Calculation (min) (ACUTE ONLY): 23 min   Charges:   PT Evaluation $PT Eval Low Complexity: 1 Low PT Treatments $Therapeutic Activity: 8-22 mins       Turner Daniels, SPT  09/13/2021, 12:47 PM

## 2021-09-13 NOTE — Assessment & Plan Note (Signed)
Replete and recheck, replete magnesium also as it is low/hypomagnesemia

## 2021-09-13 NOTE — Assessment & Plan Note (Signed)
Continue Zoloft 

## 2021-09-13 NOTE — Assessment & Plan Note (Signed)
Continue Xanax as needed 

## 2021-09-13 NOTE — Care Management Important Message (Signed)
Important Message  Patient Details  Name: Spero GeraldsMarcella R Waldroup MRN: [id number] Date of Birth: 01/08/1951   Medicare Important Message Given:  N/A - LOS <3 / Initial given by admissions     Johnell ComingsHolly Morghan Kester 09/13/2021, 1:36 PM

## 2021-09-13 NOTE — Hospital Course (Signed)
71 y.o. female with medical history significant for ongoing tobacco use, hypertension, hyperlipidemia, chronic anxiety/depression is admitted for pneumonia  6/8 -CT chest, continue antibiotics

## 2021-09-13 NOTE — Assessment & Plan Note (Signed)
-  Nicotine patch 

## 2021-09-13 NOTE — Evaluation (Signed)
Occupational Therapy Evaluation Patient Details Name: Ellen GeraldsMarcella R Mcnab MRN: [id number] DOB: 08/20/1950 Today's Date: 09/13/2021   History of Present Illness Pt is a 71 y/o F with PMH significant for ongoing tobacco use, hypertension, hyperlipidemia, chronic anxiety/depression, who initially presented to urgent care with complaints of productive cough, shortness of breath, subjective fevers and chills. MD assesment includes Sepsis secondary to right lower lobe community-acquired pneumonia.   Clinical Impression   Pt was seen for OT evaluation this date. Prior to hospital admission, pt was independent with mobility and ADLs. Pt lives alone in a one level house with a ramped entrance. Pt presents to acute OT demonstrating impaired ADL performance and functional mobility 2/2 decreased activity tolerance. Pt currently requires SUPERVISION, no AD with min vcs for safe hand placement toilet t/f and MOD I for bed mobility - HOB elevated with extra time. SUPERVISION, no AD for pericare and grooming tasks in standing. Education on importance of mobility for strengthening and endurance. All education complete, will sign off. Upon hospital discharge, recommend no OT follow up.     Recommendations for follow up therapy are one component of a multi-disciplinary discharge planning process, led by the attending physician.  Recommendations may be updated based on patient status, additional functional criteria and insurance authorization.   Follow Up Recommendations  No OT follow up    Assistance Recommended at Discharge PRN  Patient can return home with the following      Functional Status Assessment  Patient has had a recent decline in their functional status and demonstrates the ability to make significant improvements in function in a reasonable and predictable amount of time.  Equipment Recommendations  None recommended by OT    Recommendations for Other Services       Precautions / Restrictions  Precautions Precautions: Fall Restrictions Weight Bearing Restrictions: No      Mobility Bed Mobility Overal bed mobility: Modified Independent (HOB elevated) Bed Mobility: Supine to Sit, Sit to Supine     Supine to sit: Modified independent (Device/Increase time), HOB elevated Sit to supine: Modified independent (Device/Increase time), HOB elevated        Transfers Overall transfer level: Needs assistance Equipment used: None Transfers: Sit to/from Stand, Bed to chair/wheelchair/BSC Sit to Stand: Independent     Step pivot transfers: Supervision            Balance Overall balance assessment: Needs assistance Sitting-balance support: Feet supported, No upper extremity supported Sitting balance-Leahy Scale: Good     Standing balance support: No upper extremity supported, During functional activity Standing balance-Leahy Scale: Good                             ADL either performed or assessed with clinical judgement   ADL Overall ADL's : Needs assistance/impaired                                       General ADL Comments: SUPERVISION, no AD with min vcs for safe hand placement toilet t/f. SUPERVISION, no AD for pericare and grooming tasks in standing.      Pertinent Vitals/Pain Pain Assessment Pain Assessment: Faces Faces Pain Scale: No hurt     Hand Dominance     Extremity/Trunk Assessment Upper Extremity Assessment Upper Extremity Assessment: Overall WFL for tasks assessed   Lower Extremity Assessment Lower Extremity Assessment: Defer to PT  evaluation       Communication Communication Communication: No difficulties   Cognition Arousal/Alertness: Awake/alert Behavior During Therapy: WFL for tasks assessed/performed Overall Cognitive Status: Within Functional Limits for tasks assessed                                                  Home Living Family/patient expects to be discharged to::  Private residence Living Arrangements: Alone Available Help at Discharge: Family;Available PRN/intermittently Type of Home: House Home Access: Ramped entrance     Home Layout: One level     Bathroom Shower/Tub: Chief Strategy Officer: Standard Bathroom Accessibility: Yes   Home Equipment: None          Prior Functioning/Environment Prior Level of Function : Independent/Modified Independent             Mobility Comments: Independent community ambulator using no AD and no hx of falls ADLs Comments: Independent w/ ADLs        OT Problem List: Decreased activity tolerance;Decreased strength      OT Treatment/Interventions:      OT Goals(Current goals can be found in the care plan section) Acute Rehab OT Goals Patient Stated Goal: To go home OT Goal Formulation: With patient Time For Goal Achievement: 09/27/21 Potential to Achieve Goals: Good  OT Frequency:         AM-PAC OT "6 Clicks" Daily Activity     Outcome Measure Help from another person eating meals?: None Help from another person taking care of personal grooming?: None Help from another person toileting, which includes using toliet, bedpan, or urinal?: None Help from another person bathing (including washing, rinsing, drying)?: A Little Help from another person to put on and taking off regular upper body clothing?: None Help from another person to put on and taking off regular lower body clothing?: A Little 6 Click Score: 22   End of Session Equipment Utilized During Treatment: Other (comment) (None)  Activity Tolerance: Patient tolerated treatment well (Pt required encouragement to leave bed and perform ADLs.) Patient left: in bed;with call bell/phone within reach;with bed alarm set  OT Visit Diagnosis: Muscle weakness (generalized) (M62.81)                Time: 3614-4315 OT Time Calculation (min): 8 min Charges:  OT General Charges $OT Visit: 1 Visit OT Evaluation $OT Eval Low  Complexity: 1 Low  [employer], OTDS  Winona Shanikqua Zarzycki 09/13/2021, 12:48 PM

## 2021-09-14 DIAGNOSIS — F411 Generalized anxiety disorder: Secondary | ICD-10-CM

## 2021-09-14 LAB — BASIC METABOLIC PANEL
Anion gap: 6 (ref 5–15)
BUN: 13 mg/dL (ref 8–23)
CO2: 27 mmol/L (ref 22–32)
Calcium: 8.5 mg/dL — ABNORMAL LOW (ref 8.9–10.3)
Chloride: 104 mmol/L (ref 98–111)
Creatinine, Ser: 0.71 mg/dL (ref 0.44–1.00)
GFR, Estimated: >60 mL/min (ref >60)
Glucose, Bld: 101 mg/dL — ABNORMAL HIGH (ref 70–99)
Potassium: 3.7 mmol/L (ref 3.5–5.1)
Sodium: 137 mmol/L (ref 135–145)

## 2021-09-14 LAB — CBC
HCT: 30.2 % — ABNORMAL LOW (ref 36.0–46.0)
Hemoglobin: 10 g/dL — ABNORMAL LOW (ref 12.0–15.0)
MCH: 29.4 pg (ref 26.0–34.0)
MCHC: 33.1 g/dL (ref 30.0–36.0)
MCV: 88.8 fL (ref 80.0–100.0)
Platelets: 276 10*3/uL (ref 150–400)
RBC: 3.4 MIL/uL — ABNORMAL LOW (ref 3.87–5.11)
RDW: 14 % (ref 11.5–15.5)
WBC: 13.2 10*3/uL — ABNORMAL HIGH (ref 4.0–10.5)
nRBC: 0 % (ref 0.0–0.2)

## 2021-09-14 LAB — URINE CULTURE

## 2021-09-14 MED ORDER — AMOXICILLIN-POT CLAVULANATE 500-125 MG PO TABS: 1.0000 | ORAL_TABLET | Freq: Three times a day (TID) | ORAL | 0 refills | Status: AC

## 2021-09-14 NOTE — Progress Notes (Signed)
Discharge instructions reviewed with patient including followup visits and new medications.  Understanding was verbalized and all questions were answered.  IVs removed without complication; patient tolerated well.  Patient discharged home via wheelchair in stable condition escorted by volunteer staff.  

## 2021-09-14 NOTE — Discharge Summary (Signed)
Physician Discharge Summary   Patient: Ellen Fleming MRN: [id number] DOB: 1950/04/12  Admit date:     09/12/2021  Discharge date: 09/14/2021  Discharge Physician: Delfino Lovett   PCP: Gracelyn Nurse, MD   Recommendations at discharge:   Follow-up with outpatient providers as requested  Discharge Diagnoses: Principal Problem:   CAP (community acquired pneumonia) Active Problems:   Nicotine dependence   Hypertension   Depression   Generalized anxiety disorder   Sepsis due to pneumonia [facility])   Hypokalemia  Hospital Course: 71 y.o. female with medical history significant for ongoing tobacco use, hypertension, hyperlipidemia, chronic anxiety/depression is admitted for pneumonia  6/8 -CT chest, continue antibiotics  Assessment and Plan: * CAP (community acquired pneumonia) Responded well with antibiotics.  Leukocytosis improving.  She is afebrile and her symptoms are much improved.  Nicotine dependence Nicotine patch  Hypokalemia/hypomagnesemia Repleted and resolved  Sepsis due to pneumonia (HCC) Sepsis present on admission and now resolved.  Continue antibiotics  Generalized anxiety disorder Continue Xanax as needed  Depression Continue Zoloft  Hypertension Continue lisinopril, Inderal, HCTZ         Disposition: Home Diet recommendation:  Discharge Diet Orders (From admission, onward)     Start     Ordered   09/14/21 0000  Diet - low sodium heart healthy        09/14/21 0822           Carb modified diet DISCHARGE MEDICATION: Allergies as of 09/14/2021       Reactions   Sulfa Antibiotics Hives        Medication List     TAKE these medications    AeroChamber Plus inhaler Use with inhaler   albuterol 108 (90 Base) MCG/ACT inhaler Commonly known as: VENTOLIN HFA Inhale 1-2 puffs into the lungs every 4 (four) hours as needed for wheezing or shortness of breath.   ALPRAZolam 0.5 MG tablet Commonly known as: XANAX Take 0.5 mg by mouth 2  (two) times daily as needed for anxiety.   amoxicillin-clavulanate 500-125 MG tablet Commonly known as: Augmentin Take 1 tablet (500 mg total) by mouth 3 (three) times daily for 5 days.   benzonatate 200 MG capsule Commonly known as: TESSALON Take 1 capsule (200 mg total) by mouth 3 (three) times daily as needed for cough.   cyclobenzaprine 10 MG tablet Commonly known as: FLEXERIL Take 10 mg by mouth 3 (three) times daily.   hydrochlorothiazide 25 MG tablet Commonly known as: HYDRODIURIL Take 25 mg by mouth daily.   lisinopril 20 MG tablet Commonly known as: ZESTRIL Take 20 mg by mouth daily.   ONE-A-DAY WOMENS 50 PLUS PO Take by mouth daily.   propranolol 10 MG tablet Commonly known as: INDERAL Take 10 mg by mouth 2 (two) times daily.   rosuvastatin 10 MG tablet Commonly known as: CRESTOR Take 1 tablet by mouth daily.   sertraline 50 MG tablet Commonly known as: ZOLOFT Take 50 mg by mouth daily.        Follow-up Information     Gracelyn Nurse, MD. Go on 09/19/2021.   Specialty: Internal Medicine Why: jDr. Letitia Libra, Wednesday, 6/14 at 2:30 p.m. [facility] Discharge F/UP Contact information: [address] [place] [address] [phone]                Discharge Exam: Ceasar Mons Weights   09/12/21 1257  Weight: 59 kg   General: 71 y.o. year-old female well developed well nourished in no acute distress.  Alert and oriented x3. Cardiovascular: Regular rate and rhythm with no rubs or gallops Respiratory: Clear to auscultation bilaterally Abdomen: Soft nontender nondistended with normal bowel sounds x4 quadrants. Muskuloskeletal: No cyanosis, clubbing or edema noted bilaterally Neuro: CN II-XII intact, strength, sensation, reflexes Skin: No ulcerative lesions noted or rashes Psychiatry: Judgement and insight appear normal. Mood is appropriate for condition and setting  Condition at discharge: good  The results of significant diagnostics  from this hospitalization (including imaging, microbiology, ancillary and laboratory) are listed below for reference.   Imaging Studies: CT CHEST WO CONTRAST  Result Date: 09/13/2021 CLINICAL DATA:  Pneumonia, complication suspected, xray done EXAM: CT CHEST WITHOUT CONTRAST TECHNIQUE: Multidetector CT imaging of the chest was performed following the standard protocol without IV contrast. RADIATION DOSE REDUCTION: This exam was performed according to the departmental dose-optimization program which includes automated exposure control, adjustment of the mA and/or kV according to patient size and/or use of iterative reconstruction technique. COMPARISON:  CT chest 12/04/2017, chest x-ray 09/12/2021, modified barium swallow 07/28/2019 FINDINGS: Cardiovascular: Normal heart size. No significant pericardial effusion. The thoracic aorta is normal in caliber. Moderate atherosclerotic plaque of the thoracic aorta. Three-vessel coronary artery calcifications. Mediastinum/Nodes: No gross hilar adenopathy, noting limited sensitivity for the detection of hilar adenopathy on this noncontrast study. Calcified right hilar lymph node. Prominent but nonenlarged mediastinal lymph nodes. No enlarged mediastinal or axillary lymph nodes. Thyroid gland and trachea demonstrate no significant findings. There is redemonstration of a 3.7 x 2.3 cm lesion along the cervical esophagus. Lungs/Pleura: Biapical pleural/pulmonary scarring. Interval worsening of mild diffuse bronchiectasis with bronchial wall thickening. Patchy peribronchovascular and peripheral ground-glass airspace and consolidative opacities some of which are nodular-like in morphology. Stable right upper lobe calcification (3:73). No pulmonary mass. No pleural effusion. No pneumothorax. Upper Abdomen: No acute abnormality. Musculoskeletal: No chest wall abnormality. No suspicious lytic or blastic osseous lesions. No acute displaced fracture. Old healed right rib fractures.  Multilevel degenerative changes of the spine. IMPRESSION: 1. Patchy peribronchovascular and peripheral ground-glass airspace and consolidative opacities some of which are nodular-like in morphology. Finding may represent infection versus inflammation. Recommend follow-up CT in 3 months to evaluate for resolution and exclude underlying malignancy. 2. Interval worsening of mild diffuse bronchiectasis with bronchial wall thickening. 3. Prominent but nonenlarged mediastinal lymph nodes. 4. No gross hilar adenopathy, noting limited sensitivity for the detection of hilar adenopathy on this noncontrast study. 5. 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 6.  Aortic Atherosclerosis (ICD10-I70.0). Electronically Signed   By: Tish Frederickson M.D.   On: 09/13/2021 15:23   DG Chest 2 View  Result Date: 09/12/2021 CLINICAL DATA:  Shortness of breath and cough.  Recent pneumonia. EXAM: CHEST - 2 VIEW COMPARISON:  07/13/2021.  07/03/2019. FINDINGS: Heart size remains normal. Mediastinal shadows are normal. Mild chronic scarring persists at the left lung base. Mild patchy density persists at the right lung base, that could represent scarring or mild residual pneumonia. No visible effusion. No worsening or new finding. IMPRESSION: Mild residual patchy density at the right lung base that could be post inflammatory scarring or mild residual pneumonia. Electronically Signed   By: Paulina Fusi M.D.   On: 09/12/2021 12:19    Microbiology: Results for orders placed or performed during the hospital encounter of 09/12/21  Resp Panel by RT-PCR (Flu A&B, Covid) Anterior Nasal Swab     Status: None   Collection Time: 09/12/21  1:08 PM   Specimen: Anterior Nasal Swab  Result Value  Ref Range Status   SARS Coronavirus 2 by RT PCR NEGATIVE NEGATIVE Final    Comment: (NOTE) SARS-CoV-2 target nucleic acids are NOT DETECTED.  The SARS-CoV-2 RNA is generally detectable in upper respiratory specimens during the acute  phase of infection. The lowest concentration of SARS-CoV-2 viral copies this assay can detect is 138 copies/mL. A negative result does not preclude SARS-Cov-2 infection and should not be used as the sole basis for treatment or other patient management decisions. A negative result may occur with  improper specimen collection/handling, submission of specimen other than nasopharyngeal swab, presence of viral mutation(s) within the areas targeted by this assay, and inadequate number of viral copies(<138 copies/mL). A negative result must be combined with clinical observations, patient history, and epidemiological information. The expected result is Negative.  Fact Sheet for Patients:  BloggerCourse.com  Fact Sheet for Healthcare Providers:  SeriousBroker.it  This test is no t yet approved or cleared by the [place] FDA and  has been authorized for detection and/or diagnosis of SARS-CoV-2 by FDA under an Emergency Use Authorization (EUA). This EUA will remain  in effect (meaning this test can be used) for the duration of the COVID-19 declaration under Section 564(b)(1) of the Act, 21 U.S.C.section 360bbb-3(b)(1), unless the authorization is terminated  or revoked sooner.       Influenza A by PCR NEGATIVE NEGATIVE Final   Influenza B by PCR NEGATIVE NEGATIVE Final    Comment: (NOTE) The Xpert Xpress SARS-CoV-2/FLU/RSV plus assay is intended as an aid in the diagnosis of influenza from Nasopharyngeal swab specimens and should not be used as a sole basis for treatment. Nasal washings and aspirates are unacceptable for Xpert Xpress SARS-CoV-2/FLU/RSV testing.  Fact Sheet for Patients: BloggerCourse.com  Fact Sheet for Healthcare Providers: SeriousBroker.it  This test is not yet approved or cleared by the [place] FDA and has been authorized for detection and/or diagnosis of  SARS-CoV-2 by FDA under an Emergency Use Authorization (EUA). This EUA will remain in effect (meaning this test can be used) for the duration of the COVID-19 declaration under Section 564(b)(1) of the Act, 21 U.S.C. section 360bbb-3(b)(1), unless the authorization is terminated or revoked.  Performed at [facility], [address] [place], [place] [address]   Blood Culture (routine x 2)     Status: None (Preliminary result)   Collection Time: 09/12/21  2:26 PM   Specimen: BLOOD  Result Value Ref Range Status   Specimen Description BLOOD RIGHT ANTECUBITAL  Final   Special Requests   Final    BOTTLES DRAWN AEROBIC AND ANAEROBIC Blood Culture adequate volume   Culture   Final    NO GROWTH 2 DAYS Performed at [facility], [address], [place], [place] [address]    Report Status PENDING  Incomplete  Blood Culture (routine x 2)     Status: None (Preliminary result)   Collection Time: 09/12/21  2:26 PM   Specimen: BLOOD  Result Value Ref Range Status   Specimen Description BLOOD BLOOD LEFT ARM  Final   Special Requests   Final    BOTTLES DRAWN AEROBIC AND ANAEROBIC Blood Culture adequate volume   Culture   Final    NO GROWTH 2 DAYS Performed at [facility], [address], [place], [place] [address]    Report Status PENDING  Incomplete  Urine Culture     Status: Abnormal   Collection Time: 09/12/21 10:04 PM   Specimen: In/Out Cath Urine  Result Value  Ref Range Status   Specimen Description   Final    IN/OUT CATH URINE Performed at [facility], [address] [place], [place] [address]    Special Requests   Final    NONE Performed at [facility], [address] [place], [place] [address]    Culture MULTIPLE SPECIES PRESENT, SUGGEST RECOLLECTION (A)  Final   Report Status 09/14/2021 FINAL  Final    Labs: CBC: Recent Labs  Lab 09/12/21 1308 09/13/21 0646 09/14/21 0513  WBC 29.4* 20.9* 13.2*  NEUTROABS 26.0*  17.1*  --   HGB 11.6* 9.6* 10.0*  HCT 35.4* 28.7* 30.2*  MCV 92.4 89.4 88.8  PLT 313 263 276   Basic Metabolic Panel: Recent Labs  Lab 09/12/21 1308 09/13/21 0646 09/14/21 0513  NA 133* 136 137  K 3.6 3.4* 3.7  CL 99 103 104  CO2 25 26 27   GLUCOSE 129* 101* 101*  BUN 18 13 13   CREATININE 0.99 0.64 0.71  CALCIUM 9.2 8.4* 8.5*  MG  --  1.6*  --   PHOS  --  2.9  --    Liver Function Tests: Recent Labs  Lab 09/12/21 1308 09/13/21 0646  AST 35 22  ALT 27 20  ALKPHOS 125 106  BILITOT 0.6 0.4  PROT 7.5 6.2*  ALBUMIN 3.7 2.9*   CBG: No results for input(s): "GLUCAP" in the last 168 hours.  Discharge time spent: greater than 30 minutes.  Signed: Delfino Lovett, MD [facility] 09/14/2021

## 2021-09-14 NOTE — TOC Initial Note (Signed)
Transition of Care [facility]) - Initial/Assessment Note    Patient Details  Name: Ellen Fleming MRN: [id number] Date of Birth: Oct 24, 1950  Transition of Care [facility]) CM/SW Contact:    Truddie Hidden, RN Phone Number: 09/14/2021, 10:18 AM  Clinical Narrative:                  Transition of Care [facility]) Screening Note   Patient Details  Name: Ellen Fleming Date of Birth: 04-10-1950   Transition of Care [facility]) CM/SW Contact:    Truddie Hidden, RN Phone Number: 09/14/2021, 10:18 AM    Transition of Care Department (TOC) has reviewed patient and no TOC needs have been identified at this time. We will continue to monitor patient advancement through interdisciplinary progression rounds. If new patient transition needs arise, please place a TOC consult.          Patient Goals and CMS Choice        Expected Discharge Plan and Services           Expected Discharge Date: 09/14/21                                    Prior Living Arrangements/Services                       Activities of Daily Living Home Assistive Devices/Equipment: None ADL Screening (condition at time of admission) Patient's cognitive ability adequate to safely complete daily activities?: Yes Is the patient deaf or have difficulty hearing?: No Does the patient have difficulty seeing, even when wearing glasses/contacts?: No Does the patient have difficulty concentrating, remembering, or making decisions?: No Patient able to express need for assistance with ADLs?: Yes Does the patient have difficulty dressing or bathing?: No Independently performs ADLs?: Yes (appropriate for developmental age) Does the patient have difficulty walking or climbing stairs?: No Weakness of Legs: None Weakness of Arms/Hands: None  Permission Sought/Granted                  Emotional Assessment              Admission diagnosis:  CAP (community acquired pneumonia) [J18.9] Community acquired  pneumonia of right lower lobe of lung [J18.9] Sepsis with acute hypoxic respiratory failure, due to unspecified organism, unspecified whether septic shock present (HCC) [A41.9, R65.20, J96.01] Patient Active Problem List   Diagnosis Date Noted   Hypokalemia 09/13/2021   CAP (community acquired pneumonia) 09/12/2021   History of alcohol dependence (HCC) 12/01/2019   Sepsis due to pneumonia (HCC) 06/30/2019   Vitamin D deficiency 06/08/2019   Pharmacologic therapy 11/23/2018   Disorder of skeletal system 11/23/2018   Problems influencing health status 11/23/2018   Prolapse of female pelvic organs 09/13/2016   Musculoskeletal pain 11/27/2015   Elevated C-reactive protein (CRP) 09/06/2015   Elevated sed rate 09/06/2015   Osteoporosis, 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   Osteoarthritis of hip (Right) 05/22/2015   Chronic lower extremity pain (2ry area of Pain) (Right) 02/21/2015   Chronic pain syndrome 02/06/2015   Long term current use of opiate analgesic 02/06/2015   Long term prescription opiate use 02/06/2015   Opiate use (30 MME/Day) 02/06/2015   Encounter for therapeutic drug level monitoring 02/06/2015   Opiate dependence (HCC) 02/06/2015   Chronic low back pain (1ry area of Pain) (R>L) w/o sciatica 02/06/2015   Lumbar spondylosis 02/06/2015   Chronic lumbar radicular pain (  Right) 02/06/2015   Chronic hip pain (Right) 02/06/2015   Myofascial pain syndrome 02/06/2015   Neck pain 02/06/2015   History of closed head injury 02/06/2015   Depression 02/06/2015   Generalized anxiety disorder 02/06/2015   Spondylosis of cervical 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 or radiculopathy 02/06/2015   Nicotine dependence 02/06/2015   Chronic cervical radicular pain (Left) 02/06/2015   Compression fracture of L2 lumbar vertebra with routine healing 02/06/2015   Nicotine dependence, uncomplicated 02/06/2015   Major depressive disorder, single episode 02/06/2015   Wedge compression fracture of second lumbar  vertebra with routine healing 02/06/2015   H/O cerebral parenchymal hemorrhage 03/25/2014   Hyperlipidemia 03/25/2014   Hypertension 03/25/2014   MVP (mitral valve prolapse) 03/25/2014   Cystocele, midline 01/07/2013   Mixed stress and urge urinary incontinence 01/07/2013   Alcohol dependence (HCC) 09/06/2012   Closed fracture of part of fibula with tibia 09/06/2012   Closed fracture pubis (HCC) 09/06/2012   PTSD (post-traumatic stress disorder) 09/06/2012   Uterine prolapse 09/06/2012   Subarachnoid hemorrhage (HCC) 09/06/2012   L2 vertebral fracture (HCC) 09/06/2012   Fracture of multiple pubic rami (HCC) 09/06/2012   Trauma 09/06/2012   Vertebral fracture 09/06/2012   Posttraumatic stress disorder 09/06/2012   PCP:  Gracelyn NurseJohnston, John D, MD Pharmacy:   [facility] [phone]#[id number] [place] - [address] [facility] [address] [place] [id number] Phone: [phone] Fax: [phone]     Social Determinants of Health (SDOH) Interventions    Readmission Risk Interventions     No data to display

## 2021-09-17 LAB — LEGIONELLA PNEUMOPHILA SEROGP 1 UR AG: L. pneumophila Serogp 1 Ur Ag: NEGATIVE

## 2021-09-17 LAB — CULTURE, BLOOD (ROUTINE X 2)
Culture: NO GROWTH
Culture: NO GROWTH
Special Requests: ADEQUATE
Special Requests: ADEQUATE

## 2021-10-30 ENCOUNTER — Other Ambulatory Visit: Payer: Self-pay | Admitting: Pulmonary Disease

## 2021-10-30 DIAGNOSIS — J189 Pneumonia, unspecified organism: Secondary | ICD-10-CM

## 2021-11-08 ENCOUNTER — Other Ambulatory Visit: Payer: Self-pay | Admitting: Internal Medicine

## 2021-11-08 DIAGNOSIS — Z1231 Encounter for screening mammogram for malignant neoplasm of breast: Secondary | ICD-10-CM

## 2021-11-20 ENCOUNTER — Ambulatory Visit
Admission: RE | Admit: 2021-11-20 | Discharge: 2021-11-20 | Disposition: A | Discharge status: Home / Self Care | Payer: Medicare Other | Source: Ambulatory Visit | Attending: Pulmonary Disease | Admitting: Pulmonary Disease

## 2021-11-20 DIAGNOSIS — J189 Pneumonia, unspecified organism: Secondary | ICD-10-CM

## 2021-11-20 MED ORDER — IOPAMIDOL (ISOVUE-300) INJECTION 61%
75.0000 mL | Freq: Once | INTRAVENOUS | Status: AC | PRN
  Administered 2021-11-20: 75 mL via INTRAVENOUS

## 2021-11-21 ENCOUNTER — Ambulatory Visit: Payer: Medicare Other

## 2022-09-19 IMAGING — CT CT CHEST W/O CM
2 of 4 series · 15 of 36 positions shown, 18 images · non-contrast
Comparison: CT chest 12/04/2017, chest x-ray 09/12/2021, modified
barium swallow 07/28/2019

CLINICAL DATA: Pneumonia, complication suspected, xray done



[Series 2: thorax · axial · 0.69mm/px · z∈[-685,-407]mm · 12 of 165 slices shown, 15 images]
[im 13/165  mediastinal]
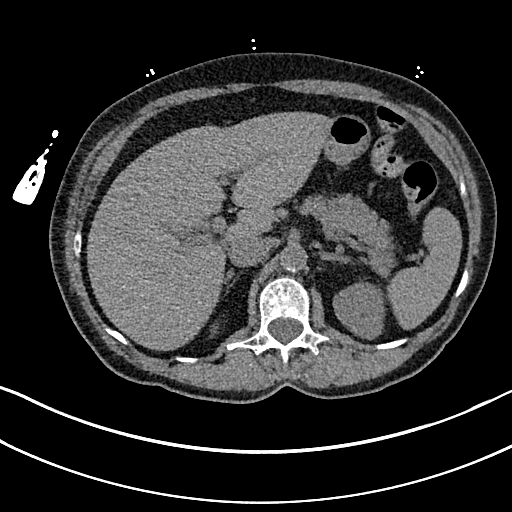
[im 13/165  lung]
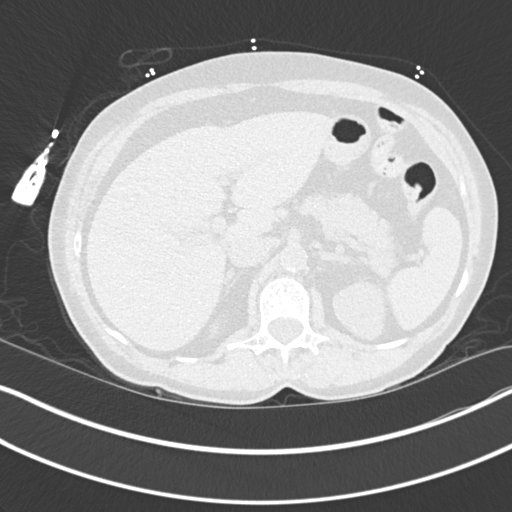
[im 26/165  lung]
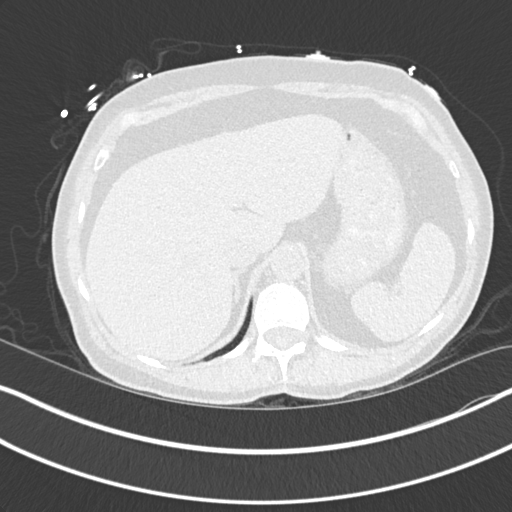
[im 38/165  lung]
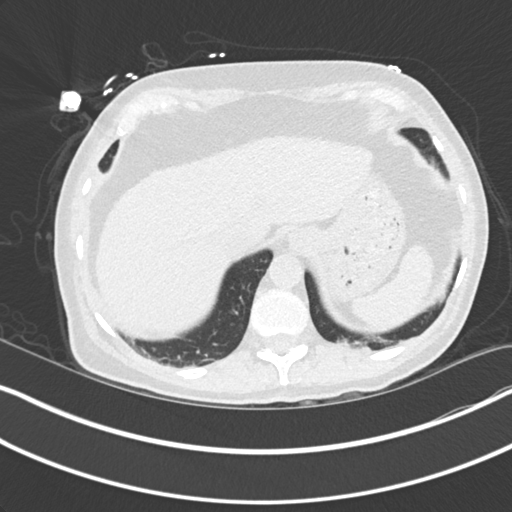
[im 51/165  lung]
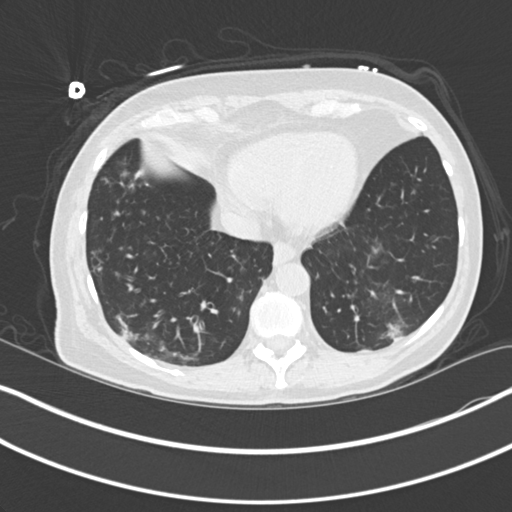
[im 64/165  mediastinal]
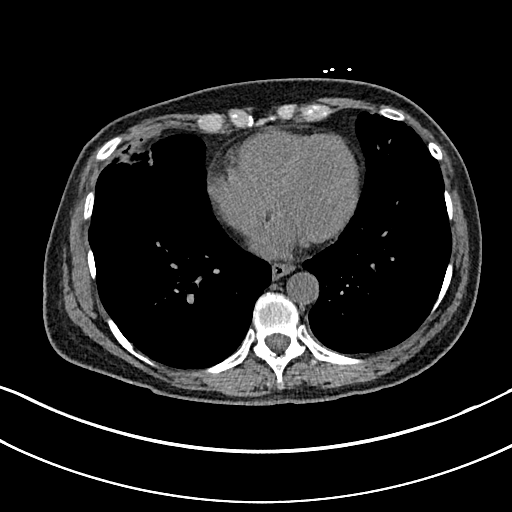
[im 64/165  lung]
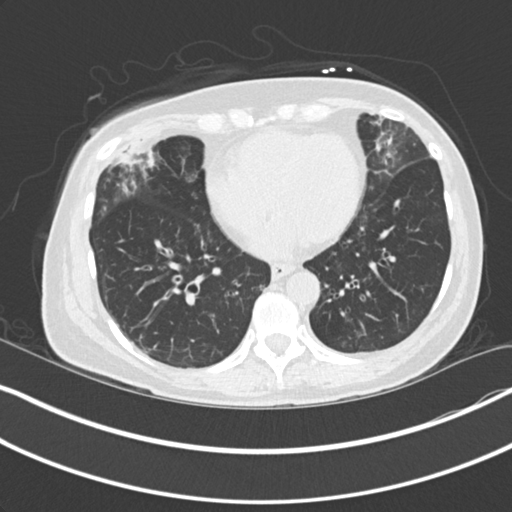
[im 76/165  lung]
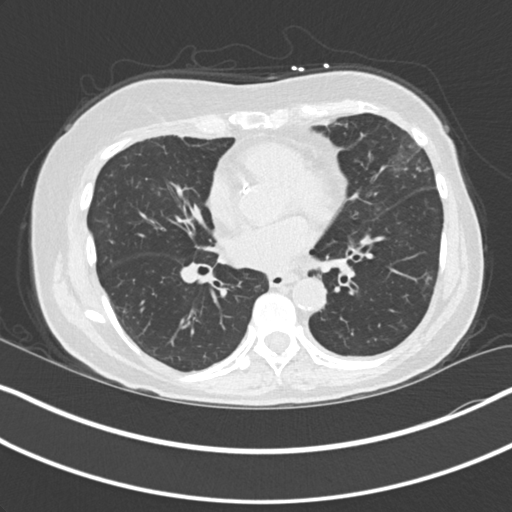
[im 89/165  lung]
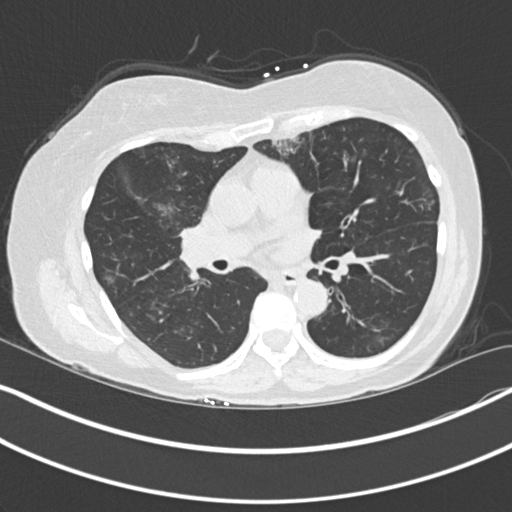
[im 101/165  lung]
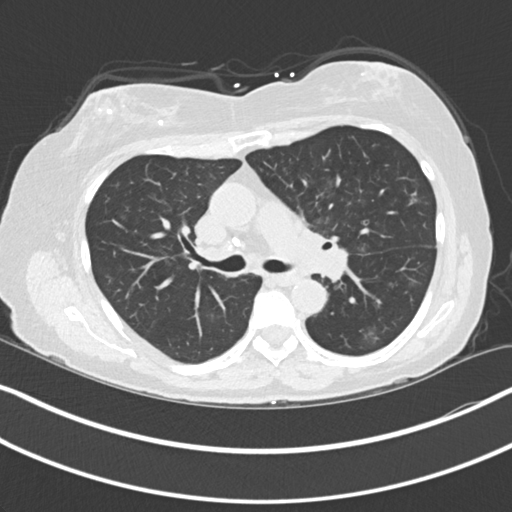
[im 114/165  mediastinal]
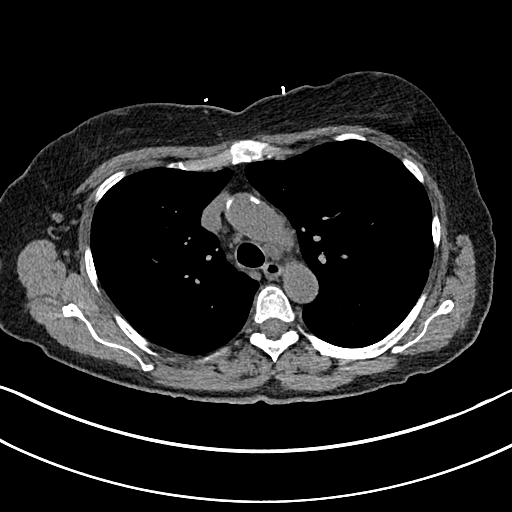
[im 114/165  lung]
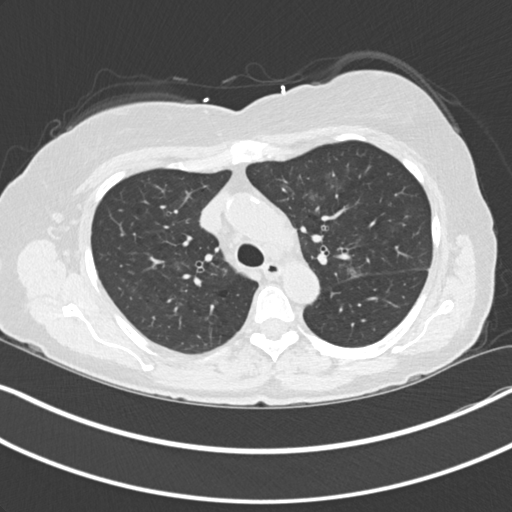
[im 127/165  lung]
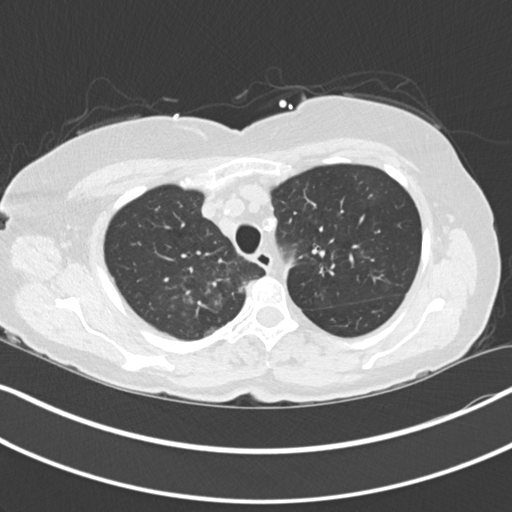
[im 139/165  lung]
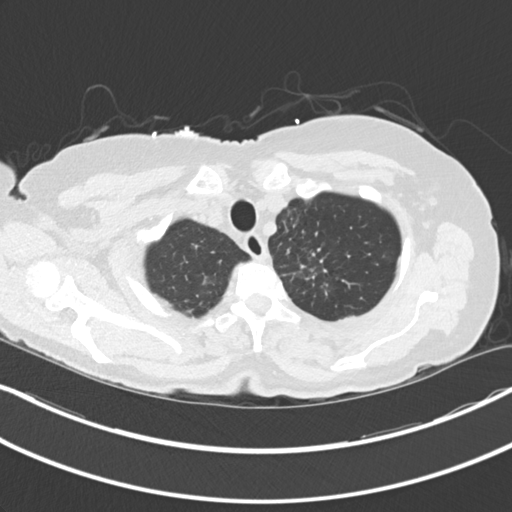
[im 152/165  lung]
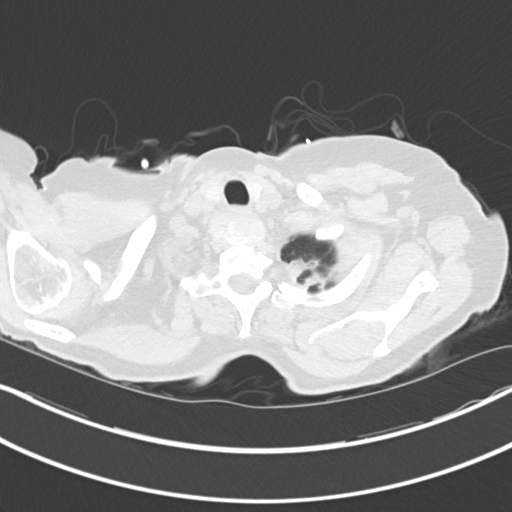

[Series 5: coronal · coronal · 0.69mm/px · 3 of 117 slices shown]
[im 24/117  lung]
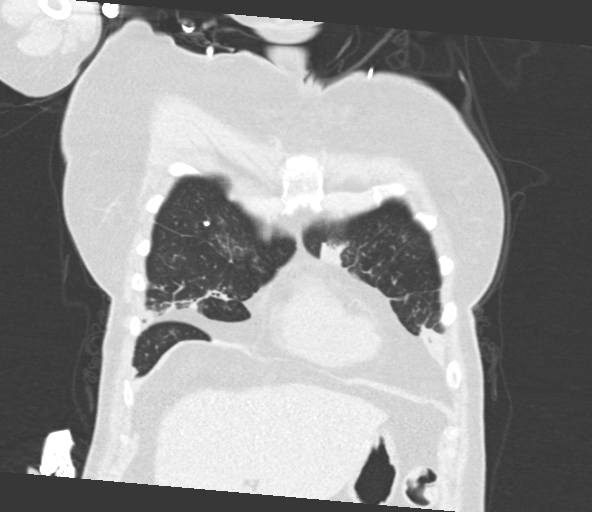
[im 47/117  lung]
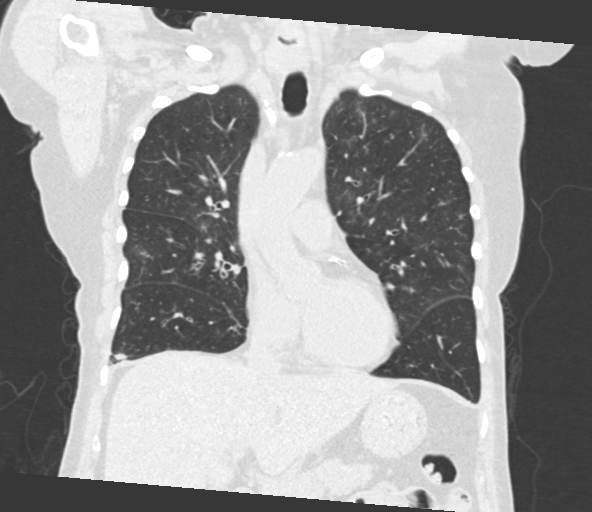
[im 70/117  lung]
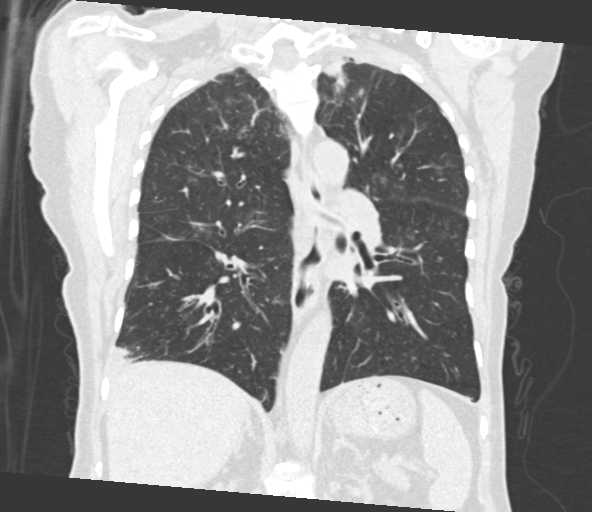

[15 of 36 positions shown; findings below may reference images not displayed]

FINDINGS: Cardiovascular: Normal heart size. No significant pericardial
effusion. The thoracic aorta is normal in caliber. Moderate
atherosclerotic plaque of the thoracic aorta. Three-vessel coronary
artery calcifications.

Mediastinum/Nodes: No gross hilar adenopathy, noting limited
sensitivity for the detection of hilar adenopathy on this
noncontrast study. Calcified right hilar lymph node. Prominent but
nonenlarged mediastinal lymph nodes. No enlarged mediastinal or
axillary lymph nodes. Thyroid gland and trachea demonstrate no
significant findings. There is redemonstration of a 3.7 x 2.3 cm
lesion along the cervical esophagus.

Lungs/Pleura: Biapical pleural/pulmonary scarring. Interval
worsening of mild diffuse bronchiectasis with bronchial wall
thickening. Patchy peribronchovascular and peripheral ground-glass
airspace and consolidative opacities some of which are nodular-like
in morphology. Stable right upper lobe calcification ([DATE]). No
pulmonary mass. No pleural effusion. No pneumothorax.

Upper Abdomen: No acute abnormality.

Musculoskeletal:

No chest wall abnormality.

No suspicious lytic or blastic osseous lesions. No acute displaced
fracture. Old healed right rib fractures. Multilevel degenerative
changes of the spine.
IMPRESSION: 1. Patchy peribronchovascular and peripheral ground-glass airspace
and consolidative opacities some of which are nodular-like in
morphology. Finding may represent infection versus inflammation.
Recommend follow-up CT in 3 months to evaluate for resolution and
exclude underlying malignancy.
2. Interval worsening of mild diffuse bronchiectasis with bronchial
wall thickening.
3. Prominent but nonenlarged mediastinal lymph nodes.
4. No gross hilar adenopathy, noting limited sensitivity for the
detection of hilar adenopathy on this noncontrast study.
5. A 3.7 x 2.3 cm posterior cervical esophageal diverticula.
6.  Aortic Atherosclerosis ([id number]).

## 2022-09-27 ENCOUNTER — Ambulatory Visit
Admission: RE | Admit: 2022-09-27 | Discharge: 2022-09-27 | Disposition: A | Discharge status: Home / Self Care | Payer: 59 | Source: Ambulatory Visit | Attending: Internal Medicine | Admitting: Internal Medicine

## 2022-09-27 DIAGNOSIS — Z1231 Encounter for screening mammogram for malignant neoplasm of breast: Secondary | ICD-10-CM

## 2023-02-19 ENCOUNTER — Encounter: Payer: Self-pay | Admitting: Emergency Medicine

## 2023-03-21 ENCOUNTER — Other Ambulatory Visit: Payer: Self-pay | Admitting: Emergency Medicine

## 2023-03-21 DIAGNOSIS — Z87891 Personal history of nicotine dependence: Secondary | ICD-10-CM

## 2023-03-21 DIAGNOSIS — Z122 Encounter for screening for malignant neoplasm of respiratory organs: Secondary | ICD-10-CM

## 2023-03-21 DIAGNOSIS — F17210 Nicotine dependence, cigarettes, uncomplicated: Secondary | ICD-10-CM

## 2023-03-26 ENCOUNTER — Encounter: Payer: Self-pay | Admitting: Physician Assistant

## 2023-03-26 ENCOUNTER — Ambulatory Visit: Payer: 59 | Admitting: Physician Assistant

## 2023-03-26 DIAGNOSIS — F17210 Nicotine dependence, cigarettes, uncomplicated: Secondary | ICD-10-CM

## 2023-03-26 NOTE — Progress Notes (Signed)
Virtual Visit via Telephone Note  I connected with Ellen Fleming on 03/26/23 at 11:30 AM EST by telephone and verified that I am speaking with the correct person using two identifiers.  Location: Patient: Home Provider: Remotely   I discussed the limitations, risks, security and privacy concerns of performing an evaluation and management service by telephone and the availability of in person appointments. I also discussed with the patient that there may be a patient responsible charge related to this service. The patient expressed understanding and agreed to proceed.    Shared Decision Making Visit Lung Cancer Screening Program [phone])   Eligibility: Age 72 y.o. Pack Years Smoking History Calculation: 43.5 Recent History of coughing up blood: No Unexplained weight loss? No ( >Than 15 pounds within the last 6 months ) Prior History Lung / other cancer: No (Diagnosis within the last 5 years already requiring surveillance chest CT Scans). Smoking Status Current Smoker   Visit Components:     Discussion included one or more decision making aids. Yes Discussion included risk/benefits of screening. Yes Discussion included potential follow up diagnostic testing for abnormal scans. Yes Discussion included meaning and risk of Over Diagnosis. Yes Discussion included meaning and risk of False Positives. Yes Discussion included meaning of total radiation exposure. Yes  Counseling Included: Importance of adherence to annual lung cancer LDCT screening. Yes Impact of comorbidities on ability to participate in the program. Yes Ability and willingness to under diagnostic treatment. Yes  Smoking Cessation Counseling: Current Smokers:  Discussed importance of smoking cessation. Yes Information about tobacco cessation classes and interventions provided to patient. Yes Symptomatic Patient. No  Counseling: NA Diagnosis Code: Tobacco Use Z72.0 Asymptomatic Patient Yes   Counseling  (Intermediate counseling: > three minutes counseling) [id number]  Rutherford Guys, PA-C

## 2023-03-26 NOTE — Patient Instructions (Signed)

## 2023-04-14 ENCOUNTER — Ambulatory Visit: Admission: RE | Admit: 2023-04-14 | Payer: 59 | Source: Ambulatory Visit

## 2023-04-16 ENCOUNTER — Ambulatory Visit
Admission: RE | Admit: 2023-04-16 | Discharge: 2023-04-16 | Disposition: A | Discharge status: Home / Self Care | Payer: 59 | Source: Ambulatory Visit | Attending: Acute Care | Admitting: Acute Care

## 2023-04-16 DIAGNOSIS — F17210 Nicotine dependence, cigarettes, uncomplicated: Secondary | ICD-10-CM | POA: Insufficient documentation

## 2023-04-16 DIAGNOSIS — Z87891 Personal history of nicotine dependence: Secondary | ICD-10-CM | POA: Diagnosis present

## 2023-04-16 DIAGNOSIS — Z122 Encounter for screening for malignant neoplasm of respiratory organs: Secondary | ICD-10-CM | POA: Diagnosis present

## 2023-04-25 ENCOUNTER — Other Ambulatory Visit: Payer: Self-pay

## 2023-04-25 DIAGNOSIS — Z87891 Personal history of nicotine dependence: Secondary | ICD-10-CM

## 2023-04-25 DIAGNOSIS — Z122 Encounter for screening for malignant neoplasm of respiratory organs: Secondary | ICD-10-CM

## 2023-04-25 DIAGNOSIS — F17210 Nicotine dependence, cigarettes, uncomplicated: Secondary | ICD-10-CM

## 2024-03-29 ENCOUNTER — Other Ambulatory Visit: Payer: Self-pay | Admitting: Emergency Medicine

## 2024-03-29 DIAGNOSIS — Z122 Encounter for screening for malignant neoplasm of respiratory organs: Secondary | ICD-10-CM

## 2024-03-29 DIAGNOSIS — F17210 Nicotine dependence, cigarettes, uncomplicated: Secondary | ICD-10-CM

## 2024-03-29 DIAGNOSIS — J449 Chronic obstructive pulmonary disease, unspecified: Secondary | ICD-10-CM

## 2024-04-16 ENCOUNTER — Ambulatory Visit: Admission: RE | Admit: 2024-04-16

## 2024-04-16 ENCOUNTER — Ambulatory Visit

## 2024-04-23 ENCOUNTER — Ambulatory Visit
Admission: RE | Admit: 2024-04-23 | Discharge: 2024-04-23 | Disposition: A | Discharge status: Home / Self Care | Source: Ambulatory Visit | Attending: Emergency Medicine | Admitting: Emergency Medicine

## 2024-04-23 DIAGNOSIS — J449 Chronic obstructive pulmonary disease, unspecified: Secondary | ICD-10-CM | POA: Insufficient documentation

## 2024-04-23 DIAGNOSIS — Z122 Encounter for screening for malignant neoplasm of respiratory organs: Secondary | ICD-10-CM | POA: Diagnosis present

## 2024-04-23 DIAGNOSIS — F17210 Nicotine dependence, cigarettes, uncomplicated: Secondary | ICD-10-CM | POA: Insufficient documentation
